# Patient Record
Sex: Female | Born: 2000
Health system: Southern US, Community
[De-identification: ages and names within clinical notes are randomized; demographics above are authoritative.]

## PROBLEM LIST (undated history)

## (undated) DIAGNOSIS — F909 Attention-deficit hyperactivity disorder, unspecified type: Secondary | ICD-10-CM

## (undated) DIAGNOSIS — G43909 Migraine, unspecified, not intractable, without status migrainosus: Secondary | ICD-10-CM

## (undated) DIAGNOSIS — R339 Retention of urine, unspecified: Secondary | ICD-10-CM

## (undated) DIAGNOSIS — T7840XA Allergy, unspecified, initial encounter: Secondary | ICD-10-CM

## (undated) DIAGNOSIS — G129 Spinal muscular atrophy, unspecified: Secondary | ICD-10-CM

## (undated) DIAGNOSIS — F329 Major depressive disorder, single episode, unspecified: Secondary | ICD-10-CM

## (undated) DIAGNOSIS — K219 Gastro-esophageal reflux disease without esophagitis: Secondary | ICD-10-CM

## (undated) DIAGNOSIS — J45909 Unspecified asthma, uncomplicated: Secondary | ICD-10-CM

## (undated) DIAGNOSIS — F32A Depression, unspecified: Secondary | ICD-10-CM

## (undated) HISTORY — DX: Migraine, unspecified, not intractable, without status migrainosus: G43.909

## (undated) HISTORY — DX: Allergy, unspecified, initial encounter: T78.40XA

## (undated) HISTORY — DX: Attention-deficit hyperactivity disorder, unspecified type: F90.9

## (undated) HISTORY — DX: Major depressive disorder, single episode, unspecified: F32.9

## (undated) HISTORY — DX: Depression, unspecified: F32.A

## (undated) HISTORY — PX: KNEE SURGERY: SHX244

## (undated) HISTORY — PX: CLUB FOOT RELEASE: SHX1363

## (undated) SURGERY — EGD (ESOPHAGOGASTRODUODENOSCOPY)
Anesthesia: Moderate Sedation

---
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
## 2018-01-18 ENCOUNTER — Encounter: Payer: Self-pay | Admitting: Primary Care

## 2018-01-18 ENCOUNTER — Ambulatory Visit: Payer: BLUE CROSS/BLUE SHIELD | Admitting: Primary Care

## 2018-01-18 VITALS — BP 100/62 | HR 81 | Temp 98.2°F | Ht 59.75 in | Wt 98.2 lb

## 2018-01-18 DIAGNOSIS — G121 Other inherited spinal muscular atrophy: Secondary | ICD-10-CM | POA: Diagnosis not present

## 2018-01-18 DIAGNOSIS — N946 Dysmenorrhea, unspecified: Secondary | ICD-10-CM | POA: Diagnosis not present

## 2018-01-18 LAB — POCT URINE PREGNANCY: Preg Test, Ur: NEGATIVE

## 2018-01-18 MED ORDER — NORETHIN ACE-ETH ESTRAD-FE 1-20 MG-MCG PO TABS: 1.0000 | ORAL_TABLET | Freq: Every day | ORAL | 3 refills | Status: DC

## 2018-01-18 NOTE — Patient Instructions (Addendum)
Take your first birth control pill on the Sunday after your period starts. For example, if you start your period on Monday, then take the birth control pill that following Sunday. If you start your period on Sunday, then take your first pill that same day.  You must take your pill at the same time each day.  If you miss a pill then take the pill as soon as you remember, and also take your pill at the regularly scheduled time, even if this means taking two pills in one day. If you miss more than two pills consecutively, then please call me.  It may take three to six months for birth control pills to regulate your cycle and improve symptoms.  It was a pleasure to meet you today! Please don't hesitate to call or message me with any questions. Welcome to [facility]!   Oral Contraception Information Oral contraceptive pills (OCPs) are medicines taken to prevent pregnancy. OCPs work by preventing the ovaries from releasing eggs. The hormones in OCPs also cause the cervical mucus to thicken, preventing the sperm from entering the uterus. The hormones also cause the uterine lining to become thin, not allowing a fertilized egg to attach to the inside of the uterus. OCPs are highly effective when taken exactly as prescribed. However, OCPs do not prevent sexually transmitted diseases (STDs). Safe sex practices, such as using condoms along with the pill, can help prevent STDs. Before taking the pill, you may have a physical exam and Pap test. Your health care provider may order blood tests. The health care provider will make sure you are a good candidate for oral contraception. Discuss with your health care provider the possible side effects of the OCP you may be prescribed. When starting an OCP, it can take 2 to 3 months for the body to adjust to the changes in hormone levels in your body. Types of oral contraception  The combination pill-This pill contains estrogen and progestin (synthetic progesterone)  hormones. The combination pill comes in 21-day, 28-day, or 91-day packs. Some types of combination pills are meant to be taken continuously (365-day pills). With 21-day packs, you do not take pills for 7 days after the last pill. With 28-day packs, the pill is taken every day. The last 7 pills are without hormones. Certain types of pills have more than 21 hormone-containing pills. With 91-day packs, the first 84 pills contain both hormones, and the last 7 pills contain no hormones or contain estrogen only.  The minipill-This pill contains the progesterone hormone only. The pill is taken every day continuously. It is very important to take the pill at the same time each day. The minipill comes in packs of 28 pills. All 28 pills contain the hormone. Advantages of oral contraceptive pills  Decreases premenstrual symptoms.  Treats menstrual period cramps.  Regulates the menstrual cycle.  Decreases a heavy menstrual flow.  May treatacne, depending on the type of pill.  Treats abnormal uterine bleeding.  Treats polycystic ovarian syndrome.  Treats endometriosis.  Can be used as emergency contraception. Things that can make oral contraceptive pills less effective OCPs can be less effective if:  You forget to take the pill at the same time every day.  You have a stomach or intestinal disease that lessens the absorption of the pill.  You take OCPs with other medicines that make OCPs less effective, such as antibiotics, certain HIV medicines, and some seizure medicines.  You take expired OCPs.  You forget to restart the  pill on day 7, when using the packs of 21 pills.  Risks associated with oral contraceptive pills Oral contraceptive pills can sometimes cause side effects, such as:  Headache.  Nausea.  Breast tenderness.  Irregular bleeding or spotting.  Combination pills are also associated with a small increased risk of:  Blood clots.  Heart attack.  Stroke.  This  information is not intended to replace advice given to you by your health care provider. Make sure you discuss any questions you have with your health care provider. Document Released: 12/19/2002 Document Revised: 03/05/2016 Document Reviewed: 03/19/2013 Elsevier Interactive Patient Education  [employer]2018 Elsevier Inc.

## 2018-01-18 NOTE — Progress Notes (Signed)
Subjective:    Patient ID: Terry Newton, female    DOB: 09-30-2001, 17 y.o.   MRN: [id number]  HPI  Terry Newton is a 17 year old female who presents today to establish care and discuss the problems mentioned below. Will obtain old records.  1) Spinal Muscular Atrophy: Diagnosed around the age of 18 and confirmed through genetic testing (DYNC1H1). Currently following with [facility] annually. Ambulates with L[place]strand crutches and does well.   2) Dysmenorrhea: Menarche in 8th grade. LMP 01/05/18. Has been under additional stress since transitioning from high school to college.  She'll experience severe cramping during her menstrual cycles which last 3-4 days on average. Symptoms began in December 2018. She'll take Tylenol and Ibuprofen without any improvement. She also reports a heavy menstrual period on 01/05/18 which was her LMP. She's noticed more irritability that is most prominent during her menstrual cycle. She is sexually active, uses protection with condoms. She is not on birth control and has never been on birth control in the past. She's interested in birth control pills.   Review of Systems  Constitutional: Negative for fever.  Respiratory: Negative for shortness of breath.   Cardiovascular: Negative for chest pain.  Genitourinary: Positive for menstrual problem. Negative for dysuria, frequency, pelvic pain and vaginal discharge.  Neurological:       Chronic lower extremity weakness secondary to musclar atrophy       Past Medical History:  Diagnosis Date  . GERD (gastroesophageal reflux disease)   . SMA (spinal muscular atrophy) (HCC)   . Urinary retention    Extended bladder, holds urine for extended periods of time.     Social History   Socioeconomic History  . Marital status: Single    Spouse name: Not on file  . Number of children: Not on file  . Years of education: Not on file  . Highest education level: Not on file  Occupational History  .  Not on file  Social Needs  . Financial resource strain: Not on file  . Food insecurity:    Worry: Not on file    Inability: Not on file  . Transportation needs:    Medical: Not on file    Non-medical: Not on file  Tobacco Use  . Smoking status: Never Smoker  . Smokeless tobacco: Never Used  Substance and Sexual Activity  . Alcohol use: No  . Drug use: No  . Sexual activity: Never    Birth control/protection: Abstinence    Comment: LMP this past weekend.  LMP prior was 65 days ago.  Periods are usually regular with only a little cramping.  Lifestyle  . Physical activity:    Days per week: Not on file    Minutes per session: Not on file  . Stress: Not on file  Relationships  . Social connections:    Talks on phone: Not on file    Gets together: Not on file    Attends religious service: Not on file    Active member of club or organization: Not on file    Attends meetings of clubs or organizations: Not on file    Relationship status: Not on file  . Intimate partner violence:    Fear of current or ex partner: Not on file    Emotionally abused: Not on file    Physically abused: Not on file    Forced sexual activity: Not on file  Other Topics Concern  . Not on file  Social History  Narrative   Birth history - Born by repeat c/s, no complications during pregnancy or delivery.  No sick contacts.  No thoughts of anxiety or self harm.  Lives with mother, father, 1 dog, no smoke exposure.  Patient was delayed at reaching developmental milestones, but once they were reached she has developed fine.      Single.   Attends [employer] and is studying Administration.    Enjoys spending time with boyfriend.     Past Surgical History:  Procedure Laterality Date  . CLUB FOOT RELEASE    . ESOPHAGOGASTRODUODENOSCOPY N/A 06/12/2016   Procedure: ESOPHAGOGASTRODUODENOSCOPY (EGD);  Surgeon: Adelene Amasichard Quan, MD;  [facility];  Service: Endoscopy;  Laterality: N/A;  . KNEE SURGERY     Plates  placed to bilateral knees to slow growth.    No family history on file.  Allergies  Allergen Reactions  . Pineapple Nausea And Vomiting    No current outpatient medications on file prior to visit.   No current facility-administered medications on file prior to visit.     BP 100/62   Pulse 81   Temp 98.2 F (36.8 C) (Oral)   Ht 4' 11.75" (1.518 m) Comment: with boots  Wt 98 lb 4 oz (44.6 kg)   LMP 12/31/2017   SpO2 98%   BMI 19.35 kg/m    Objective:   Physical Exam  Constitutional: She appears well-nourished.  Neck: Neck supple.  Cardiovascular: Normal rate and regular rhythm.  Pulmonary/Chest: Effort normal and breath sounds normal.  Musculoskeletal:  Ambulates well with Lofstrand crutches  Skin: Skin is warm and dry.  Psychiatric: She has a normal mood and affect.          Assessment & Plan:

## 2018-01-18 NOTE — Assessment & Plan Note (Signed)
Present since December 2018, also starting to notice heavy menstrual cycles and irritability. Also wishes to start OCP's for prevention of pregnancy as she is sexually active.  Urine pregnancy today negative. Rx for Junel FE sent to pharmacy. Discussed start date, instructions for missed pills, expectations for improved symptoms, back up coverage for 7 days.   Pap smear due at age 17.

## 2018-01-18 NOTE — Assessment & Plan Note (Signed)
Following [facility]. Ambulates well with Lofstrand crutches.

## 2018-01-29 ENCOUNTER — Encounter: Payer: Self-pay | Admitting: Primary Care

## 2018-03-11 ENCOUNTER — Ambulatory Visit: Payer: BLUE CROSS/BLUE SHIELD | Admitting: Primary Care

## 2018-03-11 ENCOUNTER — Encounter: Payer: Self-pay | Admitting: Primary Care

## 2018-03-11 VITALS — BP 112/70 | HR 81 | Temp 98.1°F | Ht 59.75 in | Wt 96.5 lb

## 2018-03-11 DIAGNOSIS — R300 Dysuria: Secondary | ICD-10-CM

## 2018-03-11 LAB — POC URINALSYSI DIPSTICK (AUTOMATED)
Bilirubin, UA: NEGATIVE
Glucose, UA: NEGATIVE
KETONES UA: NEGATIVE
Nitrite, UA: NEGATIVE
PROTEIN UA: NEGATIVE
RBC UA: NEGATIVE
Urobilinogen, UA: 0.2 E.U./dL
pH, UA: 6 (ref 5.0–8.0)

## 2018-03-11 NOTE — Patient Instructions (Signed)
Make sure to drink plenty of water to stay hydrated.  I'll be in touch once I receive your urine culture results.  It was a pleasure to see you today!

## 2018-03-11 NOTE — Progress Notes (Signed)
Subjective:    Patient ID: Terry Newton, female    DOB: 03/02/2001, 17 y.o.   MRN: [id number]  HPI  Terry Newton is a 17 year old female with a history of muscular atrophy and neuromuscular weakness who presents today with a chief complaint of dysuria.  She also reports slight pelvic pressure but thinks this was constipation as she ate a large meal for dinner. Pelvic pressure was relieved with a large bowel movement the following day. She's also noticed some urinary frequency.   Symptoms began one week ago when transitioning to her menstrual cycle on her pill pack. Denies fevers, hematuria. She started noticing an improvement in symptoms 3 days ago after getting back from the beach. She's been drinking some cranberry juice, has not taken anything OTC for symptoms.  Review of Systems  Constitutional: Negative for fever.  Gastrointestinal: Negative for abdominal pain and nausea.  Genitourinary: Positive for dysuria and frequency. Negative for difficulty urinating, hematuria, pelvic pain and vaginal discharge.       Past Medical History:  Diagnosis Date  . ADHD    On Ritalin, Vyvanse  . Depression   . GERD (gastroesophageal reflux disease)   . Migraines   . SMA (spinal muscular atrophy) (HCC)   . Urinary retention    Extended bladder, holds urine for extended periods of time.     Social History   Socioeconomic History  . Marital status: Single    Spouse name: Not on file  . Number of children: Not on file  . Years of education: Not on file  . Highest education level: Not on file  Occupational History  . Not on file  Social Needs  . Financial resource strain: Not on file  . Food insecurity:    Worry: Not on file    Inability: Not on file  . Transportation needs:    Medical: Not on file    Non-medical: Not on file  Tobacco Use  . Smoking status: Never Smoker  . Smokeless tobacco: Never Used  Substance and Sexual Activity  . Alcohol use: No  . Drug use: No  .  Sexual activity: Never    Birth control/protection: Abstinence    Comment: LMP this past weekend.  LMP prior was 65 days ago.  Periods are usually regular with only a little cramping.  Lifestyle  . Physical activity:    Days per week: Not on file    Minutes per session: Not on file  . Stress: Not on file  Relationships  . Social connections:    Talks on phone: Not on file    Gets together: Not on file    Attends religious service: Not on file    Active member of club or organization: Not on file    Attends meetings of clubs or organizations: Not on file    Relationship status: Not on file  . Intimate partner violence:    Fear of current or ex partner: Not on file    Emotionally abused: Not on file    Physically abused: Not on file    Forced sexual activity: Not on file  Other Topics Concern  . Not on file  Social History Narrative   Birth history - Born by repeat c/s, no complications during pregnancy or delivery.  No sick contacts.  No thoughts of anxiety or self harm.  Lives with mother, father, 1 dog, no smoke exposure.  Patient was delayed at reaching developmental milestones, but once they were reached  she has developed fine.      Single.   Attends [employer] and is studying Administration.    Enjoys spending time with boyfriend.     Past Surgical History:  Procedure Laterality Date  . CLUB FOOT RELEASE    . ESOPHAGOGASTRODUODENOSCOPY N/A 06/12/2016   Procedure: ESOPHAGOGASTRODUODENOSCOPY (EGD);  Surgeon: Adelene Amas, MD;  Location: [facility]COPY;  Service: Endoscopy;  Laterality: N/A;  . KNEE SURGERY     Plates placed to bilateral knees to slow growth.    No family history on file.  Allergies  Allergen Reactions  . Pineapple Nausea And Vomiting    Current Outpatient Medications on File Prior to Visit  Medication Sig Dispense Refill  . norethindrone-ethinyl estradiol (JUNEL FE 1/20) 1-20 MG-MCG tablet Take 1 tablet by mouth daily. 3 Package 3   No current  facility-administered medications on file prior to visit.     BP 112/70   Pulse 81   Temp 98.1 F (36.7 C) (Oral)   Ht 4' 11.75" (1.518 m)   Wt 96 lb 8 oz (43.8 kg)   LMP 02/06/2018   SpO2 98%   BMI 19.00 kg/m    Objective:   Physical Exam  Constitutional: She appears well-nourished.  Neck: Neck supple.  Cardiovascular: Normal rate and regular rhythm.  Respiratory: Effort normal and breath sounds normal.  GI: Soft. Normal appearance. There is no CVA tenderness.  Skin: Skin is warm and dry.           Assessment & Plan:  Dysuria:  Also with frequency x 4-5 days, improved and mostly resolved now. Exam today unremarkable. No vaginal symptoms. UA today with 1+ leuks, negative nitrites and blood. Culture sent. Push intake of water. Continue with small amount of cranberry juice daily. Will await urine culture results.   Doreene Nest, NP

## 2018-03-12 LAB — URINE CULTURE
MICRO NUMBER: 90657988
SPECIMEN QUALITY:: ADEQUATE

## 2018-06-29 ENCOUNTER — Ambulatory Visit: Payer: BLUE CROSS/BLUE SHIELD | Admitting: Family Medicine

## 2018-06-29 ENCOUNTER — Encounter: Payer: Self-pay | Admitting: Family Medicine

## 2018-06-29 VITALS — BP 104/62 | HR 107 | Temp 98.5°F | Ht 59.75 in | Wt 96.5 lb

## 2018-06-29 DIAGNOSIS — J069 Acute upper respiratory infection, unspecified: Secondary | ICD-10-CM

## 2018-06-29 DIAGNOSIS — B9789 Other viral agents as the cause of diseases classified elsewhere: Secondary | ICD-10-CM

## 2018-06-29 MED ORDER — BENZONATATE 200 MG PO CAPS: 200.0000 mg | ORAL_CAPSULE | Freq: Three times a day (TID) | ORAL | 1 refills | Status: DC | PRN

## 2018-06-29 NOTE — Progress Notes (Signed)
Subjective:    Patient ID: Terry Newton, female    DOB: 2001-07-15, 16 y.o.   MRN: [id number]  HPI Here for uri symptoms / ? Or allergies   17 yo  Pt of NP [facility]   Started with sneezing and runny nose  A week later woke up with dry /sore throat  Then a cough out of nowhere   Cough is prod - phlegm - just a little / clear  Nasal d/c =clear also   [facility] brand allergy med  No nasal congestion  Started taking delsym - helps more during the day than night time    Feels pressure in her ears occ headache  No facial pain   Patient Active Problem List   Diagnosis Date Noted  . Viral URI with cough 06/29/2018  . Dysmenorrhea 01/18/2018  . 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 Constipation 06/13/2016  . Neuromuscular weakness (HCC) 11/23/2011   Past Medical History:  Diagnosis Date  . ADHD    On Ritalin, Vyvanse  . Depression   . GERD (gastroesophageal reflux disease)   . Migraines   . SMA (spinal muscular atrophy) (HCC)   . Urinary retention    Extended bladder, holds urine for extended periods of time.   Past Surgical History:  Procedure Laterality Date  . CLUB FOOT RELEASE    . ESOPHAGOGASTRODUODENOSCOPY N/A 06/12/2016   Procedure: ESOPHAGOGASTRODUODENOSCOPY (EGD);  Surgeon: Adelene Amas, MD;  Location: [facility] ENDOSCOPY;  Service: Endoscopy;  Laterality: N/A;  . KNEE SURGERY     Plates placed to bilateral knees to slow growth.   Social History   Tobacco Use  . Smoking status: Never Smoker  . Smokeless tobacco: Never Used  Substance Use Topics  . Alcohol use: No  . Drug use: No   History reviewed. No pertinent family history. Allergies  Allergen Reactions  . Pineapple Nausea And Vomiting   Current Outpatient Medications on File Prior to Visit  Medication Sig Dispense Refill  . norethindrone-ethinyl estradiol (JUNEL FE 1/20) 1-20 MG-MCG tablet Take 1 tablet by mouth daily. 3 Package 3   No  current facility-administered medications on file prior to visit.      Review of Systems  Constitutional: Positive for appetite change and fatigue. Negative for fever.  HENT: Positive for congestion, postnasal drip, rhinorrhea, sinus pressure, sneezing and sore throat. Negative for ear pain.   Eyes: Negative for pain and discharge.  Respiratory: Positive for cough. Negative for shortness of breath, wheezing and stridor.   Cardiovascular: Negative for chest pain.  Gastrointestinal: Negative for diarrhea, nausea and vomiting.  Genitourinary: Negative for frequency, hematuria and urgency.  Musculoskeletal: Negative for arthralgias and myalgias.  Skin: Negative for rash.  Neurological: Positive for headaches. Negative for dizziness, weakness and light-headedness.  Psychiatric/Behavioral: Negative for confusion and dysphoric mood.       Objective:   Physical Exam  Constitutional: She appears well-developed and well-nourished. No distress.  Well appearing   HENT:  Head: Normocephalic and atraumatic.  Right Ear: External ear normal.  Left Ear: External ear normal.  Mouth/Throat: Oropharynx is clear and moist.  Nares are injected and congested  No sinus tenderness Clear rhinorrhea and post nasal drip   Eyes: Pupils are equal, round, and reactive to light. Conjunctivae and EOM are normal. Right eye exhibits no discharge. Left eye exhibits no discharge.  Neck: Normal range of motion. Neck supple.  Cardiovascular: Normal rate and normal heart sounds.  Pulmonary/Chest: Effort normal  and breath sounds normal. No respiratory distress. She has no wheezes. She has no rales. She exhibits no tenderness.  Lymphadenopathy:    She has no cervical adenopathy.  Neurological: She is alert. No cranial nerve deficit.  Skin: Skin is warm and dry. No rash noted.  Psychiatric: She has a normal mood and affect.          Assessment & Plan:   Problem List Items Addressed This Visit      Respiratory     Viral URI with cough - Primary    Reassuring exam  Disc symptomatic care - see instructions on AVS  Added tessalon prn cough  Update if not starting to improve in a week or if worsening

## 2018-06-29 NOTE — Assessment & Plan Note (Signed)
Reassuring exam  Disc symptomatic care - see instructions on AVS  Added tessalon prn cough  Update if not starting to improve in a week or if worsening

## 2018-06-29 NOTE — Patient Instructions (Addendum)
I think you have a viral cold plus or minus allergies  Continue delsym  Try zyrtec 10 mg once daily for runny nose/sneezing and drip  Also nasal saline spray 1-2 times per day  Tylenol for headache as needed   Try the tessalon for cough as well   Watch for sinus pressure or fever or worse cough   Update if not starting to improve in a week or if worsening

## 2018-07-06 ENCOUNTER — Ambulatory Visit: Payer: BLUE CROSS/BLUE SHIELD | Admitting: Primary Care

## 2018-08-19 NOTE — Telephone Encounter (Signed)
Will you please call patient?   

## 2018-08-22 NOTE — Telephone Encounter (Signed)
I have called patient earlier today. She stated that she was concern that when she taking the "brown pill" regarding her period. She noticed that her period would last 1 week and the last time it was only for 1 day.    Patient stated that to please reply through MyChart.

## 2018-11-09 ENCOUNTER — Ambulatory Visit: Payer: BLUE CROSS/BLUE SHIELD | Admitting: Primary Care

## 2018-11-09 ENCOUNTER — Encounter: Payer: Self-pay | Admitting: Primary Care

## 2018-11-09 VITALS — BP 114/72 | HR 70 | Temp 98.2°F | Ht 59.75 in | Wt 98.2 lb

## 2018-11-09 DIAGNOSIS — R51 Headache: Secondary | ICD-10-CM | POA: Diagnosis not present

## 2018-11-09 DIAGNOSIS — R0602 Shortness of breath: Secondary | ICD-10-CM

## 2018-11-09 DIAGNOSIS — R519 Headache, unspecified: Secondary | ICD-10-CM

## 2018-11-09 DIAGNOSIS — J45990 Exercise induced bronchospasm: Secondary | ICD-10-CM | POA: Insufficient documentation

## 2018-11-09 MED ORDER — ALBUTEROL SULFATE HFA 108 (90 BASE) MCG/ACT IN AERS: 2.0000 | INHALATION_SPRAY | RESPIRATORY_TRACT | 0 refills | Status: DC | PRN

## 2018-11-09 MED ORDER — ALBUTEROL SULFATE (2.5 MG/3ML) 0.083% IN NEBU
2.5000 mg | INHALATION_SOLUTION | Freq: Once | RESPIRATORY_TRACT | Status: AC
  Administered 2018-11-09: 2.5 mg via RESPIRATORY_TRACT

## 2018-11-09 NOTE — Assessment & Plan Note (Signed)
Daily over the last one month, sounds like she had an actual migraine recently. Neuro exam unremarkable. Recommend she start with eye exam first. If eye exam unremarkable and her symptoms persist then will have her back in for follow up.

## 2018-11-09 NOTE — Assessment & Plan Note (Addendum)
Intermittent for the last one month. Also with wheezing. Exam unremarkable today. Interesting that she's found immediate relief with SABA use. Spirometry today with mild obstruction, improvement with bronchodilator. Suspect exercise-induced asthma given symptoms with prolonged walking.  Prescription for albuterol inhaler sent to pharmacy.  Discussed to notify if she requires use of her albuterol more than with exercise/3 times weekly.

## 2018-11-09 NOTE — Addendum Note (Signed)
Addended by: Tawnya Crook on: 11/09/2018 05:25 PM   Modules accepted: Orders

## 2018-11-09 NOTE — Progress Notes (Signed)
Subjective:    Patient ID: Terry Newton, female    DOB: 10/09/2001, 18 y.o.   MRN: [id number]  HPI  Terry Newton is a 18 year old female with a history of autosomal dominant lower extremity predominant spinal muscular atrophy who presents today with multiple complaints.  1) Shortness of Breath: She also reports wheezing. This began since the weather changed from summer/fall to winter. She mostly noticed it towards late December /January this year. Her symptoms will begin when walking long distances such as walking across campus (approx 1 mile).   She's been using her boyfriend's mother inhaler, 1-2 puffs three times weekly for the last week with immediate resolve in shortness of breath and wheezing. She denies prior history of asthma, cough, rhinorrhea, post nasal drip, sneezing. She has a history of seasonal allergies with rhinorrhea.  2) Headaches: Located to bilateral frontal lobe which began in December 2019. Occur when in class, looking at a computer, looking at textbooks. She finds herself having to squint. Last eye exam was in 2012. Headaches occur daily. She has mild photophobia, some nausea. She's taken Tylenol/Ibuprofen with relief. She denies phonophobia. No prior history of migraines.   Review of Systems  Eyes: Positive for photophobia.  Respiratory: Positive for shortness of breath and wheezing.   Allergic/Immunologic: Negative for environmental allergies.  Neurological: Positive for headaches.       Past Medical History:  Diagnosis Date  . ADHD    On Ritalin, Vyvanse  . Depression   . GERD (gastroesophageal reflux disease)   . Migraines   . SMA (spinal muscular atrophy) (HCC)   . Urinary retention    Extended bladder, holds urine for extended periods of time.     Social History   Socioeconomic History  . Marital status: Single    Spouse name: Not on file  . Number of children: Not on file  . Years of education: Not on file  . Highest education level: Not  on file  Occupational History  . Not on file  Social Needs  . Financial resource strain: Not on file  . Food insecurity:    Worry: Not on file    Inability: Not on file  . Transportation needs:    Medical: Not on file    Non-medical: Not on file  Tobacco Use  . Smoking status: Never Smoker  . Smokeless tobacco: Never Used  Substance and Sexual Activity  . Alcohol use: No  . Drug use: No  . Sexual activity: Never    Birth control/protection: Abstinence    Comment: LMP this past weekend.  LMP prior was 65 days ago.  Periods are usually regular with only a little cramping.  Lifestyle  . Physical activity:    Days per week: Not on file    Minutes per session: Not on file  . Stress: Not on file  Relationships  . Social connections:    Talks on phone: Not on file    Gets together: Not on file    Attends religious service: Not on file    Active member of club or organization: Not on file    Attends meetings of clubs or organizations: Not on file    Relationship status: Not on file  . Intimate partner violence:    Fear of current or ex partner: Not on file    Emotionally abused: Not on file    Physically abused: Not on file    Forced sexual activity: Not on file  Other  Topics Concern  . Not on file  Social History Narrative   Birth history - Born by repeat c/s, no complications during pregnancy or delivery.  No sick contacts.  No thoughts of anxiety or self harm.  Lives with mother, father, 1 dog, no smoke exposure.  Patient was delayed at reaching developmental milestones, but once they were reached she has developed fine.      Single.   Attends [employer] and is studying Administration.    Enjoys spending time with boyfriend.     Past Surgical History:  Procedure Laterality Date  . CLUB FOOT RELEASE    . ESOPHAGOGASTRODUODENOSCOPY N/A 06/12/2016   Procedure: ESOPHAGOGASTRODUODENOSCOPY (EGD);  Surgeon: Adelene Amas, MD;  Location: [facility] ENDOSCOPY;  Service: Endoscopy;  Laterality:  N/A;  . KNEE SURGERY     Plates placed to bilateral knees to slow growth.    No family history on file.  Allergies  Allergen Reactions  . Pineapple Nausea And Vomiting    Current Outpatient Medications on File Prior to Visit  Medication Sig Dispense Refill  . norethindrone-ethinyl estradiol (JUNEL FE 1/20) 1-20 MG-MCG tablet Take 1 tablet by mouth daily. 3 Package 3   No current facility-administered medications on file prior to visit.     BP 114/72   Pulse 70   Temp 98.2 F (36.8 C) (Oral)   Ht 4' 11.75" (1.518 m)   Wt 98 lb 4 oz (44.6 kg)   LMP 11/09/2018   SpO2 98%   BMI 19.35 kg/m    Objective:   Physical Exam  Constitutional: She is oriented to person, place, and time. She appears well-nourished. She does not appear ill.  HENT:  Right Ear: Tympanic membrane and ear canal normal.  Left Ear: Tympanic membrane and ear canal normal.  Nose: No mucosal edema. Right sinus exhibits no maxillary sinus tenderness and no frontal sinus tenderness. Left sinus exhibits no maxillary sinus tenderness and no frontal sinus tenderness.  Mouth/Throat: Oropharynx is clear and moist.  Eyes: EOM are normal.  Neck: Neck supple.  Cardiovascular: Normal rate and regular rhythm.  Respiratory: Effort normal and breath sounds normal. She has no wheezes.  Neurological: She is alert and oriented to person, place, and time. No cranial nerve deficit.  Skin: Skin is warm and dry.           Assessment & Plan:

## 2018-11-09 NOTE — Patient Instructions (Signed)
Shortness of Breath/Wheezing/Cough: Use the albuterol inhaler. Inhale 2 puffs into the lungs every 4 to 6 hours as needed for wheezing, cough, and/or shortness of breath.   Please notify me if you require use of this inhaler for more than three days weekly or outside of exercise.  You will be contacted regarding your referral to the eye.  Please let us know if you have not been contacted within one week.   You can use Tylenol, Ibuprofen, Excedrin Migraine as needed.  It was a pleasure to see you today!

## 2018-12-02 ENCOUNTER — Encounter: Payer: Self-pay | Admitting: Primary Care

## 2018-12-02 DIAGNOSIS — G43119 Migraine with aura, intractable, without status migrainosus: Secondary | ICD-10-CM | POA: Diagnosis not present

## 2018-12-02 DIAGNOSIS — H5203 Hypermetropia, bilateral: Secondary | ICD-10-CM | POA: Diagnosis not present

## 2018-12-12 ENCOUNTER — Other Ambulatory Visit: Payer: Self-pay | Admitting: Primary Care

## 2018-12-12 DIAGNOSIS — N946 Dysmenorrhea, unspecified: Secondary | ICD-10-CM

## 2019-03-22 ENCOUNTER — Ambulatory Visit (INDEPENDENT_AMBULATORY_CARE_PROVIDER_SITE_OTHER): Payer: Medicare Other | Admitting: Primary Care

## 2019-03-22 ENCOUNTER — Encounter: Payer: Self-pay | Admitting: Primary Care

## 2019-03-22 DIAGNOSIS — R112 Nausea with vomiting, unspecified: Secondary | ICD-10-CM

## 2019-03-22 DIAGNOSIS — R110 Nausea: Secondary | ICD-10-CM | POA: Insufficient documentation

## 2019-03-22 MED ORDER — OMEPRAZOLE 20 MG PO CPDR: 20.0000 mg | DELAYED_RELEASE_CAPSULE | Freq: Every day | ORAL | 0 refills | Status: DC

## 2019-03-22 NOTE — Patient Instructions (Signed)
Start omeprazole 20 mg daily for acid buildup. Take 1 capsule every evening with dinner.  Please update me in one week as discussed, or sooner if needed.  Avoid laying flat within 2 hours after dinner. Avoid spicy/acidic/greasy food, avoid excessive caffeine.  It was a pleasure to see you today! Allie Bossier, NP-C

## 2019-03-22 NOTE — Progress Notes (Signed)
Subjective:    Patient ID: Terry Newton, female    DOB: 10/23/2000, 18 y.o.   MRN: [id number]  HPI  Virtual Visit via Video Note  I connected with Terry Newton on 03/22/19 at  8:00 AM EDT by a video enabled telemedicine application and verified that I am speaking with the correct person using two identifiers.  Location: Patient: Office Provider: Home   I discussed the limitations of evaluation and management by telemedicine and the availability of in person appointments. The patient expressed understanding and agreed to proceed.  History of Present Illness:  Ms. Terry Newton is a 18 year old female with a history of muscular atrophy DYNC1H1, exercise induced asthma who presents today with a chief complaint of nausea and vomiting.  She reports feeling "shaky" and nauseated when waking up in the morning. She'll drink water and lay back down without improvement. During the day she will feel nauseated within 30 minutes after each meal, has vomited a few times. The nausea will last for several hours between meals. These symptoms began about 2-3 weeks ago.   She denies esophageal burning, abdominal pain, fevers, decrease in appetite. She's had this occur before in high school, admitted to the hospital for recurrent vomiting and abdominal spasms. She was prescribed cyproheptadine at discharge with improvement.   She doesn't feel nauseated after drinking, only after eating. She's taken Tums for her symptoms with temporary improvement in nausea. She is trying to eat three meals daily, staying hydrated. She denies that she is pregnant.     Observations/Objective:  Alert and oriented. Appears well, not sickly. No distress. Speaking in complete sentences.   Assessment and Plan:  Symptoms suggestive of GERD, especially since her symptoms are worse in the morning when waking and will occur with meals. Tums do provide slight/temporary improvement.   She doesn't appear sickly, is  well hydrated. Will start with omeprazole 20 mg daily in the evening, she will update in one week. Also discussed triggers for GERD.  If no improvement then consider lab work up with abdominal ultrasound to evaluate gall bladder. She has no abdominal pain now.  Follow Up Instructions:  Start omeprazole 20 mg daily for acid buildup. Take 1 capsule every evening with dinner.  Please update me in one week as discussed, or sooner if needed.  Avoid laying flat within 2 hours after dinner. Avoid spicy/acidic/greasy food, avoid excessive caffeine.  It was a pleasure to see you today! [name], NP-C     I discussed the assessment and treatment plan with the patient. The patient was provided an opportunity to ask questions and all were answered. The patient agreed with the plan and demonstrated an understanding of the instructions.   The patient was advised to call back or seek an in-person evaluation if the symptoms worsen or if the condition fails to improve as anticipated.     Doreene NestKatherine K Oveda Dadamo, NP    Review of Systems  Constitutional: Negative for fever.  Gastrointestinal: Positive for nausea and vomiting. Negative for abdominal pain, constipation and diarrhea.  Neurological: Negative for dizziness.       Past Medical History:  Diagnosis Date  . ADHD    On Ritalin, Vyvanse  . Depression   . GERD (gastroesophageal reflux disease)   . Migraines   . SMA (spinal muscular atrophy) (HCC)   . Urinary retention    Extended bladder, holds urine for extended periods of time.     Social History   Socioeconomic  History  . Marital status: Single    Spouse name: Not on file  . Number of children: Not on file  . Years of education: Not on file  . Highest education level: Not on file  Occupational History  . Not on file  Social Needs  . Financial resource strain: Not on file  . Food insecurity:    Worry: Not on file    Inability: Not on file  . Transportation needs:     Medical: Not on file    Non-medical: Not on file  Tobacco Use  . Smoking status: Never Smoker  . Smokeless tobacco: Never Used  Substance and Sexual Activity  . Alcohol use: No  . Drug use: No  . Sexual activity: Never    Birth control/protection: Abstinence    Comment: LMP this past weekend.  LMP prior was 65 days ago.  Periods are usually regular with only a little cramping.  Lifestyle  . Physical activity:    Days per week: Not on file    Minutes per session: Not on file  . Stress: Not on file  Relationships  . Social connections:    Talks on phone: Not on file    Gets together: Not on file    Attends religious service: Not on file    Active member of club or organization: Not on file    Attends meetings of clubs or organizations: Not on file    Relationship status: Not on file  . Intimate partner violence:    Fear of current or ex partner: Not on file    Emotionally abused: Not on file    Physically abused: Not on file    Forced sexual activity: Not on file  Other Topics Concern  . Not on file  Social History Narrative   Birth history - Born by repeat c/s, no complications during pregnancy or delivery.  No sick contacts.  No thoughts of anxiety or self harm.  Lives with mother, father, 1 dog, no smoke exposure.  Patient was delayed at reaching developmental milestones, but once they were reached she has developed fine.      Single.   Attends [employer] and is studying Administration.    Enjoys spending time with boyfriend.     Past Surgical History:  Procedure Laterality Date  . CLUB FOOT RELEASE    . ESOPHAGOGASTRODUODENOSCOPY N/A 06/12/2016   Procedure: ESOPHAGOGASTRODUODENOSCOPY (EGD);  Surgeon: Joycelyn Rua, MD;  Location: [facility] ENDOSCOPY;  Service: Endoscopy;  Laterality: N/A;  . KNEE SURGERY     Plates placed to bilateral knees to slow growth.    No family history on file.  Allergies  Allergen Reactions  . Pineapple Nausea And Vomiting    Current Outpatient  Medications on File Prior to Visit  Medication Sig Dispense Refill  . albuterol (PROVENTIL HFA;VENTOLIN HFA) 108 (90 Base) MCG/ACT inhaler Inhale 2 puffs into the lungs every 4 (four) hours as needed for shortness of breath. 1 Inhaler 0  . JUNEL FE 1/20 1-20 MG-MCG tablet TAKE 1 TABLET BY MOUTH DAILY 84 tablet 1   No current facility-administered medications on file prior to visit.     Wt 98 lb (44.5 kg)   LMP 02/26/2019   BMI 19.30 kg/m    Objective:   Physical Exam  Constitutional: She is oriented to person, place, and time. She appears well-nourished. She does not have a sickly appearance. She does not appear ill.  Respiratory: Effort normal.  Neurological: She is alert and oriented  to person, place, and time.  Psychiatric: She has a normal mood and affect.           Assessment & Plan:

## 2019-03-22 NOTE — Assessment & Plan Note (Signed)
Symptoms suggestive of GERD, especially since her symptoms are worse in the morning when waking and will occur with meals. Tums do provide slight/temporary improvement.   She doesn't appear sickly, is well hydrated. Will start with omeprazole 20 mg daily in the evening, she will update in one week. Also discussed triggers for GERD.  If no improvement then consider lab work up with abdominal ultrasound to evaluate gall bladder. She has no abdominal pain now.

## 2019-04-29 ENCOUNTER — Other Ambulatory Visit: Payer: Self-pay | Admitting: Primary Care

## 2019-04-29 DIAGNOSIS — R0602 Shortness of breath: Secondary | ICD-10-CM

## 2019-05-05 ENCOUNTER — Emergency Department
Admission: EM | Admit: 2019-05-05 | Discharge: 2019-05-05 | Disposition: A | Discharge status: Home / Self Care | Payer: No Typology Code available for payment source | Attending: Emergency Medicine | Admitting: Emergency Medicine

## 2019-05-05 ENCOUNTER — Emergency Department: Payer: No Typology Code available for payment source

## 2019-05-05 ENCOUNTER — Other Ambulatory Visit: Payer: Self-pay

## 2019-05-05 ENCOUNTER — Encounter: Payer: Self-pay | Admitting: Emergency Medicine

## 2019-05-05 DIAGNOSIS — M25561 Pain in right knee: Secondary | ICD-10-CM | POA: Diagnosis not present

## 2019-05-05 DIAGNOSIS — M79631 Pain in right forearm: Secondary | ICD-10-CM | POA: Insufficient documentation

## 2019-05-05 DIAGNOSIS — Y999 Unspecified external cause status: Secondary | ICD-10-CM | POA: Diagnosis not present

## 2019-05-05 DIAGNOSIS — S59911A Unspecified injury of right forearm, initial encounter: Secondary | ICD-10-CM | POA: Diagnosis not present

## 2019-05-05 DIAGNOSIS — R079 Chest pain, unspecified: Secondary | ICD-10-CM | POA: Diagnosis not present

## 2019-05-05 DIAGNOSIS — Z79899 Other long term (current) drug therapy: Secondary | ICD-10-CM | POA: Insufficient documentation

## 2019-05-05 DIAGNOSIS — J45909 Unspecified asthma, uncomplicated: Secondary | ICD-10-CM | POA: Insufficient documentation

## 2019-05-05 DIAGNOSIS — S299XXA Unspecified injury of thorax, initial encounter: Secondary | ICD-10-CM | POA: Diagnosis not present

## 2019-05-05 DIAGNOSIS — Y9389 Activity, other specified: Secondary | ICD-10-CM | POA: Insufficient documentation

## 2019-05-05 HISTORY — DX: Unspecified asthma, uncomplicated: J45.909

## 2019-05-05 LAB — URINALYSIS, COMPLETE (UACMP) WITH MICROSCOPIC
Bilirubin Urine: NEGATIVE
Glucose, UA: NEGATIVE mg/dL
Ketones, ur: NEGATIVE mg/dL
Nitrite: POSITIVE — AB
Protein, ur: NEGATIVE mg/dL
Specific Gravity, Urine: 1.014 (ref 1.005–1.030)
WBC, UA: >50 WBC/hpf — ABNORMAL HIGH (ref 0–5)
pH: 7 (ref 5.0–8.0)

## 2019-05-05 LAB — POCT PREGNANCY, URINE: Preg Test, Ur: NEGATIVE

## 2019-05-05 MED ORDER — MELOXICAM 15 MG PO TABS: 15.0000 mg | ORAL_TABLET | Freq: Every day | ORAL | 1 refills | Status: AC

## 2019-05-05 MED ORDER — CEPHALEXIN 500 MG PO CAPS: 500.0000 mg | ORAL_CAPSULE | Freq: Three times a day (TID) | ORAL | 0 refills | Status: AC
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
## 2019-05-05 NOTE — ED Provider Notes (Signed)
-----------------------------------------   4:28 PM on 05/05/2019 -----------------------------------------  EKG viewed and interpreted by myself shows sinus tachycardia 106 bpm with a narrow QRS, normal axis, normal intervals, no concerning ST changes.   Harvest Dark, MD 05/05/19 [id number]
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
## 2019-06-09 ENCOUNTER — Other Ambulatory Visit: Payer: Self-pay | Admitting: Primary Care

## 2019-06-09 DIAGNOSIS — N946 Dysmenorrhea, unspecified: Secondary | ICD-10-CM

## 2019-07-21 ENCOUNTER — Other Ambulatory Visit: Payer: Self-pay | Admitting: Primary Care

## 2019-07-21 DIAGNOSIS — R0602 Shortness of breath: Secondary | ICD-10-CM

## 2019-10-20 ENCOUNTER — Telehealth: Payer: Self-pay

## 2019-10-20 ENCOUNTER — Other Ambulatory Visit: Payer: Self-pay

## 2019-10-20 ENCOUNTER — Encounter: Payer: Self-pay | Admitting: Family Medicine

## 2019-10-20 ENCOUNTER — Ambulatory Visit: Payer: Medicare Other | Admitting: Family Medicine

## 2019-10-20 VITALS — BP 98/62 | HR 131 | Temp 98.1°F | Ht 60.0 in | Wt 91.8 lb

## 2019-10-20 DIAGNOSIS — R112 Nausea with vomiting, unspecified: Secondary | ICD-10-CM | POA: Diagnosis not present

## 2019-10-20 DIAGNOSIS — E860 Dehydration: Secondary | ICD-10-CM | POA: Diagnosis not present

## 2019-10-20 MED ORDER — ONDANSETRON 8 MG PO TBDP: 8.0000 mg | ORAL_TABLET | Freq: Three times a day (TID) | ORAL | 0 refills | Status: DC | PRN

## 2019-10-20 MED ORDER — PROMETHAZINE HCL 25 MG/ML IJ SOLN
25.0000 mg | Freq: Once | INTRAMUSCULAR | Status: AC
  Administered 2019-10-20: 25 mg via INTRAMUSCULAR

## 2019-10-20 NOTE — Patient Instructions (Signed)
Good to see you today  You were given an injection of promethazine for nausea. It will make you sleepy.   I have sent in anti nausea medication to your pharmacy if you need it. It may cause constipation.   While you are awake, drink 2-3 ounces of liquid (Gatorade, Propel, etc.). Gradually introduce small bites of bland food (yogurt, mashed potatoes, apple sauce, etc.)  Questions? Can call nurse line at night or over the weekend.

## 2019-10-20 NOTE — Telephone Encounter (Signed)
On 10/16/18 pt had bottom wisdom teeth removed and pt was given oxy for pain; after taking oxy pt had N&V; Dentist advised to stop oxy and take tylenol; pt has not taking oxy since 10/16/18 and pt is still N&V. Pt cannot keep food down. Pt can keep water down. No fever, no dry mouth and no abd pain; pt last urinated normal amt at midnight;dizzy,shaky, but pt continues to vomit. Pt has no covid symptoms other than vomiting, no travel and no known exposure to + covid.pt will have someone drive pt to [facility] for appt  at 11:45 today with Harlin Heys FNP. UC & ED precautions given and pt voiced understanding.

## 2019-10-20 NOTE — Telephone Encounter (Signed)
Noted  

## 2019-10-20 NOTE — Progress Notes (Signed)
Subjective:    Patient ID: Terry Newton, female    DOB: November 23, 2000, 19 y.o.   MRN: [id number]  HPI Chief Complaint  Patient presents with  . Emesis    Recent dental procedure. Given profilactic Abx and patient started having vomiting, severe. Not able to keep anything down x 2 days. Pt having projectile vomiting at times. Denies abd pain.  Pt stopped Abx and pain meds (Amoxicillin and Oxycodone) Elevated HR - pt states that she feels like her heart is racing. Pt not able to keep much water down the last 2 days, a little today   This is a 19 yo female who presents today with above cc.  She had two wisdom teeth removed 3 days ago.  Took amoxil/oxy after her procedure following food. Several hours later, had vomiting that awoke her from sleep. She stopped amoxil/oxy. Yesterday, was able to eat and drink in am, took some ibuprofen and acetaminophen then had vomiting. Was unable to keep anything down yesterday.  Has not taken anything today for pain. No vomiting today. She was able to drink 8 ounces of water this morning and keep down. Mild nausea today. Has urinated twice recently. Feels weak, shaky, denies lightheadedness, dizziness. Has had a normal BM since surgery.     Past Medical History:  Diagnosis Date  . ADHD    On Ritalin, Vyvanse  . Asthma   . Depression   . GERD (gastroesophageal reflux disease)   . Migraines   . SMA (spinal muscular atrophy) ([facility])   . Urinary retention    Extended bladder, holds urine for extended periods of time.   Past Surgical History:  Procedure Laterality Date  . CLUB FOOT RELEASE    . ESOPHAGOGASTRODUODENOSCOPY N/A 06/12/2016   Procedure: ESOPHAGOGASTRODUODENOSCOPY (EGD);  Surgeon: Joycelyn Rua, MD;  Location[facility] ENDOSCOPY;  Service: Endoscopy;  Laterality: N/A;  . KNEE SURGERY     Plates placed to bilateral knees to slow growth.   History reviewed. No pertinent family history. Social History   Tobacco Use  . Smoking status: Never Smoker   . Smokeless tobacco: Never Used  Substance Use Topics  . Alcohol use: No  . Drug use: No      Review of Systems Per HPI    Objective:   Physical Exam Vitals reviewed.  Constitutional:      General: She is not in acute distress.    Appearance: Normal appearance. She is not ill-appearing, toxic-appearing or diaphoretic.     Comments: thin  HENT:     Head: Normocephalic and atraumatic.  Eyes:     Conjunctiva/sclera: Conjunctivae normal.  Cardiovascular:     Rate and Rhythm: Regular rhythm. Tachycardia present.     Comments: Heart rate 100 after sitting in exam room.  Pulmonary:     Effort: Pulmonary effort is normal.     Breath sounds: Normal breath sounds.  Abdominal:     General: Abdomen is flat. There is no distension.     Palpations: Abdomen is soft. There is no mass.     Tenderness: There is no abdominal tenderness. There is no guarding or rebound.     Hernia: No hernia is present.  Skin:    General: Skin is warm and dry.     Capillary Refill: Capillary refill takes less than 2 seconds.     Coloration: Skin is not pale.     Comments: Good skin turgor.   Neurological:     Mental Status: She is alert  and oriented to person, place, and time.  Psychiatric:        Mood and Affect: Mood normal.        Behavior: Behavior normal.        Thought Content: Thought content normal.        Judgment: Judgment normal.       BP 98/62 (BP Location: Left Arm, Patient Position: Sitting, Cuff Size: Normal)   Pulse (!) 131   Temp 98.1 F (36.7 C) (Temporal)   Ht 5' (1.524 m)   Wt 91 lb 12.8 oz (41.6 kg)   SpO2 98%   BMI 17.93 kg/m  Wt Readings from Last 3 Encounters:  10/20/19 91 lb 12.8 oz (41.6 kg)  05/05/19 98 lb (44.5 kg)  03/22/19 98 lb (44.5 kg)       Assessment & Plan:  1. Non-intractable vomiting with nausea, unspecified vomiting type -Discussed with patient and her father.  We will give her injection of promethazine in the office and ondansetron to take at  home. -Follow-up precautions reviewed - promethazine (PHENERGAN) injection 25 mg - ondansetron (ZOFRAN-ODT) 8 MG disintegrating tablet; Take 1 tablet (8 mg total) by mouth every 8 (eight) hours as needed for nausea.  Dispense: 10 tablet; Refill: 0  2. Mild dehydration -Good color and skin turgor.  Heart rate came down with sitting.  Discussed gradual oral rehydration and restarting solids slowly. -Follow-up precautions reviewed  Patient Instructions  Good to see you today  You were given an injection of promethazine for nausea. It will make you sleepy.   I have sent in anti nausea medication to your pharmacy if you need it. It may cause constipation.   While you are awake, drink 2-3 ounces of liquid (Gatorade, Propel, etc.). Gradually introduce small bites of bland food (yogurt, mashed potatoes, apple sauce, etc.)  Questions? Can call nurse line at night or over the weekend.     This visit occurred during the SARS-CoV-2 public health emergency.  Safety protocols were in place, including screening questions prior to the visit, additional usage of staff PPE, and extensive cleaning of exam room while observing appropriate contact time as indicated for disinfecting solutions.    Olean Ree, FNP-BC  [facility], [facility]  10/20/2019 1:08 PM

## 2019-10-24 DIAGNOSIS — G129 Spinal muscular atrophy, unspecified: Secondary | ICD-10-CM | POA: Diagnosis not present

## 2019-11-19 ENCOUNTER — Other Ambulatory Visit: Payer: Self-pay | Admitting: Primary Care

## 2019-11-19 DIAGNOSIS — R0602 Shortness of breath: Secondary | ICD-10-CM

## 2019-11-19 DIAGNOSIS — N946 Dysmenorrhea, unspecified: Secondary | ICD-10-CM

## 2019-11-24 ENCOUNTER — Encounter: Payer: Self-pay | Admitting: Primary Care

## 2019-11-24 ENCOUNTER — Ambulatory Visit: Payer: Medicare Other | Admitting: Primary Care

## 2019-11-24 ENCOUNTER — Other Ambulatory Visit: Payer: Self-pay

## 2019-11-24 ENCOUNTER — Other Ambulatory Visit (HOSPITAL_COMMUNITY)
Admission: RE | Admit: 2019-11-24 | Discharge: 2019-11-24 | Disposition: A | Discharge status: Home / Self Care | Payer: Medicare Other | Source: Ambulatory Visit | Attending: Primary Care | Admitting: Primary Care

## 2019-11-24 VITALS — BP 100/64 | HR 106 | Temp 96.8°F | Ht 60.0 in | Wt 95.2 lb

## 2019-11-24 DIAGNOSIS — R55 Syncope and collapse: Secondary | ICD-10-CM | POA: Insufficient documentation

## 2019-11-24 DIAGNOSIS — Z124 Encounter for screening for malignant neoplasm of cervix: Secondary | ICD-10-CM | POA: Insufficient documentation

## 2019-11-24 DIAGNOSIS — G121 Other inherited spinal muscular atrophy: Secondary | ICD-10-CM

## 2019-11-24 DIAGNOSIS — J45990 Exercise induced bronchospasm: Secondary | ICD-10-CM | POA: Diagnosis not present

## 2019-11-24 DIAGNOSIS — Z1151 Encounter for screening for human papillomavirus (HPV): Secondary | ICD-10-CM | POA: Insufficient documentation

## 2019-11-24 DIAGNOSIS — N946 Dysmenorrhea, unspecified: Secondary | ICD-10-CM

## 2019-11-24 DIAGNOSIS — R42 Dizziness and giddiness: Secondary | ICD-10-CM | POA: Diagnosis not present

## 2019-11-24 DIAGNOSIS — R519 Headache, unspecified: Secondary | ICD-10-CM

## 2019-11-24 MED ORDER — TOPIRAMATE 50 MG PO TABS: 50.0000 mg | ORAL_TABLET | Freq: Every day | ORAL | 0 refills | Status: DC

## 2019-11-24 NOTE — Patient Instructions (Addendum)
Start topiramate 50 mg once every evening for headache prevention. Please update me in a few weeks.  Stop by the lab prior to leaving today. I will notify you of your results once received.   You will be contacted regarding your referral to cardiology.  Please let us know if you have not been contacted within two weeks.   Ensure you are consuming 64 ounces of water daily.  It was a pleasure to see you today!

## 2019-11-24 NOTE — Assessment & Plan Note (Signed)
Acute since late 2020, four episodes total.  Labs pending including TSH, CMP, CBC. Orthostatic vitals negative. ECG today with NSR with rate of 68. No PAC/PVC, St changes. Appears similar to the ECG from 2017.  Referral placed to cardiology. Await labs.

## 2019-11-24 NOTE — Assessment & Plan Note (Signed)
Overall stable, infrequent use of albuterol inhaler. Continue to monitor.

## 2019-11-24 NOTE — Progress Notes (Signed)
Subjective:    Patient ID: Terry Newton, female    DOB: 08/06/2001, 19 y.o.   MRN: [id number]  HPI  This visit occurred during the SARS-CoV-2 public health emergency.  Safety protocols were in place, including screening questions prior to the visit, additional usage of staff PPE, and extensive cleaning of exam room while observing appropriate contact time as indicated for disinfecting solutions.   Terry Newton is a 19 year old female who presents today for follow up and medication refills. She would also like to discuss headaches and a recent syncope.  1) Birth Control: Due for pap smear, never completed before. She endorses normal monthly menstrual cycles without breakthrough bleeding. Doing well on OCP's.  2) Asthma: Overall doing well with her asthma, using albuterol inhaler infrequently.   3) Frequent Headaches: She would like to discuss daily headaches. Headaches located to bilateral frontal lobes that are daily lasting minutes to an hour.  She describes her headache episodes as throbbing with dizziness/lightheadedness, photophobia, nausea. She currently takes Ibuprofen and Tylenol with improvement.    She's never been on treatment for headaches.   4) Syncope: Intermittent with four total episodes that began in late 2020/early 2021. Episodes have occurred during rest, while sitting on the toilet, and during showers. Symptoms include feeling lightheaded, feeling dizzy, feeling weakness, seeing spots/visual disturbance, and then will wake up on the floor.  She can sense that these episodes will occur so she will lower herself to the floor.   Review of Systems  Eyes: Positive for photophobia.  Respiratory: Negative for shortness of breath and wheezing.   Cardiovascular: Negative for chest pain.  Neurological: Positive for syncope, light-headedness and headaches.       Past Medical History:  Diagnosis Date  . ADHD    On Ritalin, Vyvanse  . Asthma   . Depression   . GERD  (gastroesophageal reflux disease)   . Migraines   . SMA (spinal muscular atrophy) ([facility])   . Urinary retention    Extended bladder, holds urine for extended periods of time.     Social History   Socioeconomic History  . Marital status: Single    Spouse name: Not on file  . Number of children: Not on file  . Years of education: Not on file  . Highest education level: Not on file  Occupational History  . Not on file  Tobacco Use  . Smoking status: Never Smoker  . Smokeless tobacco: Never Used  Substance and Sexual Activity  . Alcohol use: No  . Drug use: No  . Sexual activity: Never    Birth control/protection: Abstinence    Comment: LMP this past weekend.  LMP prior was 65 days ago.  Periods are usually regular with only a little cramping.  Other Topics Concern  . Not on file  Social History Narrative   Birth history - Born by repeat c/s, no complications during pregnancy or delivery.  No sick contacts.  No thoughts of anxiety or self harm.  Lives with mother, father, 1 dog, no smoke exposure.  Patient was delayed at reaching developmental milestones, but once they were reached she has developed fine.      Single.   Attends [employer] and is studying Administration.    Enjoys spending time with boyfriend.    Social Determinants of Health   Financial Resource Strain:   . Difficulty of Paying Living Expenses: Not on file  Food Insecurity:   . Worried About Programme researcher, broadcasting/film/video in  the Last Year: Not on file  . Ran Out of Food in the Last Year: Not on file  Transportation Needs:   . Lack of Transportation (Medical): Not on file  . Lack of Transportation (Non-Medical): Not on file  Physical Activity:   . Days of Exercise per Week: Not on file  . Minutes of Exercise per Session: Not on file  Stress:   . Feeling of Stress : Not on file  Social Connections:   . Frequency of Communication with Friends and Family: Not on file  . Frequency of Social Gatherings with Friends and Family:  Not on file  . Attends Religious Services: Not on file  . Active Member of Clubs or Organizations: Not on file  . Attends Archivist Meetings: Not on file  . Marital Status: Not on file  Intimate Partner Violence:   . Fear of Current or Ex-Partner: Not on file  . Emotionally Abused: Not on file  . Physically Abused: Not on file  . Sexually Abused: Not on file    Past Surgical History:  Procedure Laterality Date  . CLUB FOOT RELEASE    . ESOPHAGOGASTRODUODENOSCOPY N/A 06/12/2016   Procedure: ESOPHAGOGASTRODUODENOSCOPY (EGD);  Surgeon: Joycelyn Rua, MD;  Location[facility] ENDOSCOPY;  Service: Endoscopy;  Laterality: N/A;  . KNEE SURGERY     Plates placed to bilateral knees to slow growth.    No family history on file.  Allergies  Allergen Reactions  . Oxycodone Nausea And Vomiting  . Pineapple Nausea And Vomiting    Current Outpatient Medications on File Prior to Visit  Medication Sig Dispense Refill  . AUROVELA FE 1/20 1-20 MG-MCG tablet TAKE 1 TABLET BY MOUTH DAILY 84 tablet 0  . VENTOLIN HFA 108 (90 Base) MCG/ACT inhaler INHALE 2 PUFFS INTO THE LUNGS EVERY 4 HOURS AS NEEDED FOR SHORTNESS OF BREATH 18 g 0  . ondansetron (ZOFRAN-ODT) 8 MG disintegrating tablet Take 1 tablet (8 mg total) by mouth every 8 (eight) hours as needed for nausea. (Patient not taking: Reported on 11/24/2019) 10 tablet 0   No current facility-administered medications on file prior to visit.    BP 100/64   Pulse (!) 106   Temp (!) 96.8 F (36 C)   Ht 5' (1.524 m)   Wt 95 lb 4 oz (43.2 kg)   LMP 11/13/2019   SpO2 100%   BMI 18.60 kg/m    Objective:   Physical Exam  Constitutional: She appears well-nourished.  Cardiovascular: Normal rate and regular rhythm.  Respiratory: Effort normal and breath sounds normal.  Musculoskeletal:     Cervical back: Neck supple.  Skin: Skin is warm and dry.  Psychiatric: She has a normal mood and affect.           Assessment & Plan:

## 2019-11-24 NOTE — Assessment & Plan Note (Signed)
Daily, with some migraine type symptoms. Discussed options for treatment and given her recent syncopal episodes we will avoid Propranolol.  Start Topamax 50 mg HS. She will update.

## 2019-11-24 NOTE — Assessment & Plan Note (Signed)
Much improved on OCP's Pap smear due today and completed today. Continue OCP's.

## 2019-11-24 NOTE — Assessment & Plan Note (Signed)
Overall stable, no continued weakness. Using braces without difficulty

## 2019-11-25 LAB — COMPREHENSIVE METABOLIC PANEL
AG Ratio: 1.7 (calc) (ref 1.0–2.5)
ALT: 11 U/L (ref 6–29)
AST: 12 U/L (ref 10–30)
Albumin: 4.5 g/dL (ref 3.6–5.1)
Alkaline phosphatase (APISO): 57 U/L (ref 31–125)
BUN: 9 mg/dL (ref 7–25)
CO2: 23 mmol/L (ref 20–32)
Calcium: 9.4 mg/dL (ref 8.6–10.2)
Chloride: 105 mmol/L (ref 98–110)
Creat: 0.54 mg/dL (ref 0.50–1.10)
Globulin: 2.7 g/dL (calc) (ref 1.9–3.7)
Glucose, Bld: 97 mg/dL (ref 65–99)
Potassium: 3.6 mmol/L (ref 3.5–5.3)
Sodium: 140 mmol/L (ref 135–146)
Total Bilirubin: 0.6 mg/dL (ref 0.2–1.2)
Total Protein: 7.2 g/dL (ref 6.1–8.1)

## 2019-11-25 LAB — CBC
HCT: 41.4 % (ref 35.0–45.0)
Hemoglobin: 14.2 g/dL (ref 11.7–15.5)
MCH: 29.5 pg (ref 27.0–33.0)
MCHC: 34.3 g/dL (ref 32.0–36.0)
MCV: 85.9 fL (ref 80.0–100.0)
MPV: 11.2 fL (ref 7.5–12.5)
Platelets: 274 10*3/uL (ref 140–400)
RBC: 4.82 10*6/uL (ref 3.80–5.10)
RDW: 12.6 % (ref 11.0–15.0)
WBC: 9.2 10*3/uL (ref 3.8–10.8)

## 2019-11-25 LAB — TSH: TSH: 1.97 mIU/L

## 2019-11-30 LAB — CYTOLOGY - PAP
Comment: NEGATIVE
Diagnosis: NEGATIVE
High risk HPV: NEGATIVE

## 2019-12-04 ENCOUNTER — Ambulatory Visit: Payer: BC Managed Care – PPO | Admitting: Cardiology

## 2019-12-11 ENCOUNTER — Encounter: Payer: Self-pay | Admitting: Cardiology

## 2019-12-11 ENCOUNTER — Ambulatory Visit (INDEPENDENT_AMBULATORY_CARE_PROVIDER_SITE_OTHER): Payer: Medicare Other

## 2019-12-11 ENCOUNTER — Other Ambulatory Visit: Payer: Self-pay

## 2019-12-11 ENCOUNTER — Ambulatory Visit (INDEPENDENT_AMBULATORY_CARE_PROVIDER_SITE_OTHER): Payer: Medicare Other | Admitting: Cardiology

## 2019-12-11 VITALS — BP 100/86 | HR 103 | Ht 60.0 in | Wt 96.5 lb

## 2019-12-11 DIAGNOSIS — R55 Syncope and collapse: Secondary | ICD-10-CM

## 2019-12-11 NOTE — Progress Notes (Signed)
Cardiology Office Note:    Date:  12/11/2019   ID:  Terry Newton, DOB 2001-06-24, MRN [id number]  PCP:  Doreene Nest, NP  Cardiologist:  Debbe Odea, MD  Electrophysiologist:  None   Referring MD: Doreene Nest, NP   Chief Complaint  Patient presents with  . New Patient (Initial Visit)    Pt states having dizziness when having a shower and a previous episode when using the restroom at home pt had a moment felt like fainting (syncope). Meds verbally reviewed w/ pt.   Terry Newton is a 19 y.o. female who is being seen today for the evaluation of syncope at the request of Doreene Nest, NP.   History of Present Illness:    Terry Newton is a 19 y.o. female with a hx of migraines, spinal muscular atrophy who presents due to syncope.  Patient states having 5 episodes of syncope which began 2 months ago.  Symptoms usually occur when patient is either taking a warm shower or just finished having a bowel movement.  She describes symptoms of dizziness, seeing bright spots prior to passing out.  Whenever she experiences the prodromes, she either sits down or lays on the ground to prevent falls.  She states passing out for 10 minutes usually.  She says what time she lays down by using her phone and then checks the time whenever she wakes up from passing out.  During the last episode, it lasted 30 minutes and her mom had to come check on her in the bathroom.  She denies any history of heart disease.  States she was born with a musculoskeletal abnormalities for which she uses crutches.  She denies chest pain, palpitations, shortness of breath with symptoms.  Past Medical History:  Diagnosis Date  . ADHD    On Ritalin, Vyvanse  . Asthma   . Depression   . GERD (gastroesophageal reflux disease)   . Migraines   . SMA (spinal muscular atrophy) (HCC)   . Urinary retention    Extended bladder, holds urine for extended periods of time.    Past Surgical  History:  Procedure Laterality Date  . CLUB FOOT RELEASE    . ESOPHAGOGASTRODUODENOSCOPY N/A 06/12/2016   Procedure: ESOPHAGOGASTRODUODENOSCOPY (EGD);  Surgeon: Adelene Amas, MD;  Location: [facility] ENDOSCOPY;  Service: Endoscopy;  Laterality: N/A;  . KNEE SURGERY     Plates placed to bilateral knees to slow growth.    Current Medications: Current Meds  Medication Sig  . AUROVELA FE 1/20 1-20 MG-MCG tablet TAKE 1 TABLET BY MOUTH DAILY  . topiramate (TOPAMAX) 50 MG tablet Take 1 tablet (50 mg total) by mouth at bedtime. For headaches.  . VENTOLIN HFA 108 (90 Base) MCG/ACT inhaler INHALE 2 PUFFS INTO THE LUNGS EVERY 4 HOURS AS NEEDED FOR SHORTNESS OF BREATH     Allergies:   Oxycodone and Pineapple   Social History   Socioeconomic History  . Marital status: Single    Spouse name: Not on file  . Number of children: Not on file  . Years of education: Not on file  . Highest education level: Not on file  Occupational History  . Not on file  Tobacco Use  . Smoking status: Never Smoker  . Smokeless tobacco: Never Used  Substance and Sexual Activity  . Alcohol use: No  . Drug use: No  . Sexual activity: Never    Birth control/protection: Abstinence    Comment: LMP this past weekend.  LMP  prior was 65 days ago.  Periods are usually regular with only a little cramping.  Other Topics Concern  . Not on file  Social History Narrative   Birth history - Born by repeat c/s, no complications during pregnancy or delivery.  No sick contacts.  No thoughts of anxiety or self harm.  Lives with mother, father, 1 dog, no smoke exposure.  Patient was delayed at reaching developmental milestones, but once they were reached she has developed fine.      Single.   Attends [employer] and is studying Administration.    Enjoys spending time with boyfriend.    Social Determinants of Health   Financial Resource Strain:   . Difficulty of Paying Living Expenses: Not on file  Food Insecurity:   . Worried About Community education officer in the Last Year: Not on file  . Ran Out of Food in the Last Year: Not on file  Transportation Needs:   . Lack of Transportation (Medical): Not on file  . Lack of Transportation (Non-Medical): Not on file  Physical Activity:   . Days of Exercise per Week: Not on file  . Minutes of Exercise per Session: Not on file  Stress:   . Feeling of Stress : Not on file  Social Connections:   . Frequency of Communication with Friends and Family: Not on file  . Frequency of Social Gatherings with Friends and Family: Not on file  . Attends Religious Services: Not on file  . Active Member of Clubs or Organizations: Not on file  . Attends Banker Meetings: Not on file  . Marital Status: Not on file     Family History: He denies any family history of heart disease.  ROS:   Please see the history of present illness.     All other systems reviewed and are negative.  EKGs/Labs/Other Studies Reviewed:    The following studies were reviewed today:   EKG:  EKG is  ordered today.  The ekg ordered today demonstrates sinus tachycardia, heart rate 103.  Recent Labs: 11/24/2019: ALT 11; BUN 9; Creat 0.54; Hemoglobin 14.2; Platelets 274; Potassium 3.6; Sodium 140; TSH 1.97  Recent Lipid Panel    Component Value Date/Time   TRIG 63 06/16/2016 0405    Physical Exam:    VS:  BP 100/86 (BP Location: Right Arm, Patient Position: Sitting, Cuff Size: Normal)   Pulse (!) 103   Ht 5' (1.524 m)   Wt 96 lb 8 oz (43.8 kg)   LMP 11/13/2019   SpO2 98%   BMI 18.85 kg/m     Wt Readings from Last 3 Encounters:  12/11/19 96 lb 8 oz (43.8 kg)  11/24/19 95 lb 4 oz (43.2 kg)  10/20/19 91 lb 12.8 oz (41.6 kg)     GEN:  Well nourished, well developed in no acute distress HEENT: Normal NECK: No JVD; No carotid bruits LYMPHATICS: No lymphadenopathy CARDIAC: Tachycardic, regular , no murmurs, rubs, gallops RESPIRATORY:  Clear to auscultation without rales, wheezing or rhonchi    ABDOMEN: Soft, non-tender, non-distended MUSCULOSKELETAL:  No edema;  SKIN: Warm and dry NEUROLOGIC:  Alert and oriented x 3 PSYCHIATRIC:  Normal affect   ASSESSMENT:    1. Syncope, unspecified syncope type    PLAN:    In order of problems listed above:  1. Patient with history of syncope.  The symptoms she describes such as warm shower and after bowel movement as suggestive of a neurogenic cause/vasovagal.  However, a  cardiac etiology cannot be ruled out.  We will get a cardiac monitor x2 weeks to evaluate for any arrhythmias.  Also get an echocardiogram to check any structural cardiac abnormalities especially with a history of musculoskeletal disease.  Low up after cardiac monitor and echocardiogram.  This note was generated in part or whole with voice recognition software. Voice recognition is usually quite accurate but there are transcription errors that can and very often do occur. I apologize for any typographical errors that were not detected and corrected.  Medication Adjustments/Labs and Tests Ordered: Current medicines are reviewed at length with the patient today.  Concerns regarding medicines are outlined above.  Orders Placed This Encounter  Procedures  . LONG TERM MONITOR (3-14 DAYS)  . EKG 12-Lead  . ECHOCARDIOGRAM COMPLETE   No orders of the defined types were placed in this encounter.   Patient Instructions  Medication Instructions:  Your physician recommends that you continue on your current medications as directed. Please refer to the Current Medication list given to you today.  *If you need a refill on your cardiac medications before your next appointment, please call your pharmacy*   Lab Work: None If you have labs (blood work) drawn today and your tests are completely normal, you will receive your results only by: Marland Kitchen MyChart Message (if you have MyChart) OR . A paper copy in the mail If you have any lab test that is abnormal or we need to change your  treatment, we will call you to review the results.   Testing/Procedures: Your physician has requested that you have an echocardiogram (after 12/25/19) . Echocardiography is a painless test that uses sound waves to create images of your heart. It provides your doctor with information about the size and shape of your heart and how well your heart's chambers and valves are working. This procedure takes approximately one hour. There are no restrictions for this procedure.  Your physician has recommended that you wear a 14 day Zio monitor, placed in office today. This monitor is a medical device that records the heart's electrical activity. Doctors most often use these monitors to diagnose arrhythmias. Arrhythmias are problems with the speed or rhythm of the heartbeat. The monitor is a small device applied to your chest. You can wear one while you do your normal daily activities. While wearing this monitor if you have any symptoms to push the button and record what you felt. Once you have worn this monitor for the period of time provider prescribed (Usually 14 days), you will return the monitor device in the postage paid box. Once it is returned they will download the data collected and provide [place] with a report which the provider will then review and we will call you with those results. Important tips:  1. Avoid showering during the first 24 hours of wearing the monitor. 2. Avoid excessive sweating to help maximize wear time. 3. Do not submerge the device, no hot tubs, and no swimming pools. 4. Keep any lotions or oils away from the patch. 5. After 24 hours you may shower with the patch on. Take brief showers with your back facing the shower head.  6. Do not remove patch once it has been placed because that will interrupt data and decrease adhesive wear time. 7. Push the button when you have any symptoms and write down what you were feeling. 8. Once you have completed wearing your monitor, remove and place  into box which has postage paid and place in  your outgoing mailbox.  9. If for some reason you have misplaced your box then call our office and we can provide another box and/or mail it off for you.           Follow-Up: At [facility], you and your health needs are our priority.  As part of our continuing mission to provide you with exceptional heart care, we have created designated Provider Care Teams.  These Care Teams include your primary Cardiologist (physician) and Advanced Practice Providers (APPs -  Physician Assistants and Nurse Practitioners) who all work together to provide you with the care you need, when you need it.  We recommend signing up for the patient portal called "MyChart".  Sign up information is provided on this After Visit Summary.  MyChart is used to connect with patients for Virtual Visits (Telemedicine).  Patients are able to view lab/test results, encounter notes, upcoming appointments, etc.  Non-urgent messages can be sent to your provider as well.   To learn more about what you can do with MyChart, go to [email or web address].    Your next appointment:   After testing   The format for your next appointment:   In Person  Provider:   Kate Sable, MD   Other Instructions  Echocardiogram An echocardiogram is a procedure that uses painless sound waves (ultrasound) to produce an image of the heart. Images from an echocardiogram can provide important information about:  Signs of coronary artery disease (CAD).  Aneurysm detection. An aneurysm is a weak or damaged part of an artery wall that bulges out from the normal force of blood pumping through the body.  Heart size and shape. Changes in the size or shape of the heart can be associated with certain conditions, including heart failure, aneurysm, and CAD.  Heart muscle function.  Heart valve function.  Signs of a past heart attack.  Fluid buildup around the heart.  Thickening of the  heart muscle.  A tumor or infectious growth around the heart valves. Tell a health care provider about:  Any allergies you have.  All medicines you are taking, including vitamins, herbs, eye drops, creams, and over-the-counter medicines.  Any blood disorders you have.  Any surgeries you have had.  Any medical conditions you have.  Whether you are pregnant or may be pregnant. What are the risks? Generally, this is a safe procedure. However, problems may occur, including:  Allergic reaction to dye (contrast) that may be used during the procedure. What happens before the procedure? No specific preparation is needed. You may eat and drink normally. What happens during the procedure?   An IV tube may be inserted into one of your veins.  You may receive contrast through this tube. A contrast is an injection that improves the quality of the pictures from your heart.  A gel will be applied to your chest.  A wand-like tool (transducer) will be moved over your chest. The gel will help to transmit the sound waves from the transducer.  The sound waves will harmlessly bounce off of your heart to allow the heart images to be captured in real-time motion. The images will be recorded on a computer. The procedure may vary among health care providers and hospitals. What happens after the procedure?  You may return to your normal, everyday life, including diet, activities, and medicines, unless your health care provider tells you not to do that. Summary  An echocardiogram is a procedure that uses painless sound waves (ultrasound) to produce an  image of the heart.  Images from an echocardiogram can provide important information about the size and shape of your heart, heart muscle function, heart valve function, and fluid buildup around your heart.  You do not need to do anything to prepare before this procedure. You may eat and drink normally.  After the echocardiogram is completed, you may  return to your normal, everyday life, unless your health care provider tells you not to do that. This information is not intended to replace advice given to you by your health care provider. Make sure you discuss any questions you have with your health care provider. Document Revised: 01/19/2019 Document Reviewed: 10/31/2016 Elsevier Patient Education  2020 [employer].      Signed, Debbe Odea, MD  12/11/2019 5:27 PM    [facility]

## 2019-12-11 NOTE — Patient Instructions (Addendum)
Medication Instructions:  Your physician recommends that you continue on your current medications as directed. Please refer to the Current Medication list given to you today.  *If you need a refill on your cardiac medications before your next appointment, please call your pharmacy*   Lab Work: None If you have labs (blood work) drawn today and your tests are completely normal, you will receive your results only by: Marland Kitchen MyChart Message (if you have MyChart) OR . A paper copy in the mail If you have any lab test that is abnormal or we need to change your treatment, we will call you to review the results.   Testing/Procedures: Your physician has requested that you have an echocardiogram (after 12/25/19) . Echocardiography is a painless test that uses sound waves to create images of your heart. It provides your doctor with information about the size and shape of your heart and how well your heart's chambers and valves are working. This procedure takes approximately one hour. There are no restrictions for this procedure.  Your physician has recommended that you wear a 14 day Zio monitor, placed in office today. This monitor is a medical device that records the heart's electrical activity. Doctors most often use these monitors to diagnose arrhythmias. Arrhythmias are problems with the speed or rhythm of the heartbeat. The monitor is a small device applied to your chest. You can wear one while you do your normal daily activities. While wearing this monitor if you have any symptoms to push the button and record what you felt. Once you have worn this monitor for the period of time provider prescribed (Usually 14 days), you will return the monitor device in the postage paid box. Once it is returned they will download the data collected and provide [place] with a report which the provider will then review and we will call you with those results. Important tips:  1. Avoid showering during the first 24 hours of  wearing the monitor. 2. Avoid excessive sweating to help maximize wear time. 3. Do not submerge the device, no hot tubs, and no swimming pools. 4. Keep any lotions or oils away from the patch. 5. After 24 hours you may shower with the patch on. Take brief showers with your back facing the shower head.  6. Do not remove patch once it has been placed because that will interrupt data and decrease adhesive wear time. 7. Push the button when you have any symptoms and write down what you were feeling. 8. Once you have completed wearing your monitor, remove and place into box which has postage paid and place in your outgoing mailbox.  9. If for some reason you have misplaced your box then call our office and we can provide another box and/or mail it off for you.           Follow-Up: At [facility], you and your health needs are our priority.  As part of our continuing mission to provide you with exceptional heart care, we have created designated Provider Care Teams.  These Care Teams include your primary Cardiologist (physician) and Advanced Practice Providers (APPs -  Physician Assistants and Nurse Practitioners) who all work together to provide you with the care you need, when you need it.  We recommend signing up for the patient portal called "MyChart".  Sign up information is provided on this After Visit Summary.  MyChart is used to connect with patients for Virtual Visits (Telemedicine).  Patients are able to view lab/test results, encounter  notes, upcoming appointments, etc.  Non-urgent messages can be sent to your provider as well.   To learn more about what you can do with MyChart, go to [email or web address].    Your next appointment:   After testing   The format for your next appointment:   In Person  Provider:   Kate Sable, MD   Other Instructions  Echocardiogram An echocardiogram is a procedure that uses painless sound waves (ultrasound) to produce an image of  the heart. Images from an echocardiogram can provide important information about:  Signs of coronary artery disease (CAD).  Aneurysm detection. An aneurysm is a weak or damaged part of an artery wall that bulges out from the normal force of blood pumping through the body.  Heart size and shape. Changes in the size or shape of the heart can be associated with certain conditions, including heart failure, aneurysm, and CAD.  Heart muscle function.  Heart valve function.  Signs of a past heart attack.  Fluid buildup around the heart.  Thickening of the heart muscle.  A tumor or infectious growth around the heart valves. Tell a health care provider about:  Any allergies you have.  All medicines you are taking, including vitamins, herbs, eye drops, creams, and over-the-counter medicines.  Any blood disorders you have.  Any surgeries you have had.  Any medical conditions you have.  Whether you are pregnant or may be pregnant. What are the risks? Generally, this is a safe procedure. However, problems may occur, including:  Allergic reaction to dye (contrast) that may be used during the procedure. What happens before the procedure? No specific preparation is needed. You may eat and drink normally. What happens during the procedure?   An IV tube may be inserted into one of your veins.  You may receive contrast through this tube. A contrast is an injection that improves the quality of the pictures from your heart.  A gel will be applied to your chest.  A wand-like tool (transducer) will be moved over your chest. The gel will help to transmit the sound waves from the transducer.  The sound waves will harmlessly bounce off of your heart to allow the heart images to be captured in real-time motion. The images will be recorded on a computer. The procedure may vary among health care providers and hospitals. What happens after the procedure?  You may return to your normal,  everyday life, including diet, activities, and medicines, unless your health care provider tells you not to do that. Summary  An echocardiogram is a procedure that uses painless sound waves (ultrasound) to produce an image of the heart.  Images from an echocardiogram can provide important information about the size and shape of your heart, heart muscle function, heart valve function, and fluid buildup around your heart.  You do not need to do anything to prepare before this procedure. You may eat and drink normally.  After the echocardiogram is completed, you may return to your normal, everyday life, unless your health care provider tells you not to do that. This information is not intended to replace advice given to you by your health care provider. Make sure you discuss any questions you have with your health care provider. Document Revised: 01/19/2019 Document Reviewed: 10/31/2016 Elsevier Patient Education  [place].

## 2020-01-19 ENCOUNTER — Ambulatory Visit (INDEPENDENT_AMBULATORY_CARE_PROVIDER_SITE_OTHER): Payer: Medicare Other

## 2020-01-19 ENCOUNTER — Other Ambulatory Visit: Payer: Self-pay

## 2020-01-19 DIAGNOSIS — R55 Syncope and collapse: Secondary | ICD-10-CM

## 2020-01-23 ENCOUNTER — Encounter: Payer: Self-pay | Admitting: Cardiology

## 2020-01-23 ENCOUNTER — Ambulatory Visit (INDEPENDENT_AMBULATORY_CARE_PROVIDER_SITE_OTHER): Payer: Medicare Other | Admitting: Cardiology

## 2020-01-23 ENCOUNTER — Other Ambulatory Visit: Payer: Self-pay

## 2020-01-23 VITALS — BP 106/60 | HR 109 | Ht 60.0 in | Wt 96.0 lb

## 2020-01-23 DIAGNOSIS — R55 Syncope and collapse: Secondary | ICD-10-CM | POA: Diagnosis not present

## 2020-01-23 NOTE — Patient Instructions (Signed)
Medication Instructions:  Your physician recommends that you continue on your current medications as directed. Please refer to the Current Medication list given to you today.  *If you need a refill on your cardiac medications before your next appointment, please call your pharmacy*   Lab Work: None ordered If you have labs (blood work) drawn today and your tests are completely normal, you will receive your results only by: . MyChart Message (if you have MyChart) OR . A paper copy in the mail If you have any lab test that is abnormal or we need to change your treatment, we will call you to review the results.   Testing/Procedures: None ordered   Follow-Up: At [facility], you and your health needs are our priority.  As part of our continuing mission to provide you with exceptional heart care, we have created designated Provider Care Teams.  These Care Teams include your primary Cardiologist (physician) and Advanced Practice Providers (APPs -  Physician Assistants and Nurse Practitioners) who all work together to provide you with the care you need, when you need it.  We recommend signing up for the patient portal called "MyChart".  Sign up information is provided on this After Visit Summary.  MyChart is used to connect with patients for Virtual Visits (Telemedicine).  Patients are able to view lab/test results, encounter notes, upcoming appointments, etc.  Non-urgent messages can be sent to your provider as well.   To learn more about what you can do with MyChart, go to [email or web address].    Your next appointment:   As needed   The format for your next appointment:   In Person  Provider:    You may see Brian Agbor-Etang, MD   or one of the following Advanced Practice Providers on your designated Care Team:    Christopher Berge, NP  Ryan Dunn, PA-C  Jacquelyn Visser, PA-C    Other Instructions N/A  

## 2020-01-23 NOTE — Progress Notes (Signed)
Cardiology Office Note:    Date:  01/23/2020   ID:  Terry Newton, DOB Sep 27, 2001, MRN [id number]  PCP:  Pleas Koch, NP  Cardiologist:  Kate Sable, MD  Electrophysiologist:  None   Referring MD: Pleas Koch, NP   Chief Complaint  Patient presents with  . Other    follow up post Zio. patient denies any sx. meds reviewed verbally with patient.      History of Present Illness:    Terry Newton is a 19 y.o. female with a hx of migraines, spinal muscular atrophy who presents for follow-up.  She was last seen due to syncope.  Symptoms were associated with warm showers or after having a bowel movement.  Neurogenic/vasovagal was high the etiology but cardiac etiology cannot be ruled out.  Echo and cardiac monitor was ordered.  Patient now presents for results.  Denies any symptoms, no new complaints.   Past Medical History:  Diagnosis Date  . ADHD    On Ritalin, Vyvanse  . Asthma   . Depression   . GERD (gastroesophageal reflux disease)   . Migraines   . SMA (spinal muscular atrophy) (Terry Newton)   . Urinary retention    Extended bladder, holds urine for extended periods of time.    Past Surgical History:  Procedure Laterality Date  . CLUB FOOT RELEASE    . ESOPHAGOGASTRODUODENOSCOPY N/A 06/12/2016   Procedure: ESOPHAGOGASTRODUODENOSCOPY (EGD);  Surgeon: Joycelyn Rua, MD;  Location: [facility] ENDOSCOPY;  Service: Endoscopy;  Laterality: N/A;  . KNEE SURGERY     Plates placed to bilateral knees to slow growth.    Current Medications: Current Meds  Medication Sig  . AUROVELA FE 1/20 1-20 MG-MCG tablet TAKE 1 TABLET BY MOUTH DAILY  . topiramate (TOPAMAX) 50 MG tablet Take 1 tablet (50 mg total) by mouth at bedtime. For headaches.  . VENTOLIN HFA 108 (90 Base) MCG/ACT inhaler INHALE 2 PUFFS INTO THE LUNGS EVERY 4 HOURS AS NEEDED FOR SHORTNESS OF BREATH     Allergies:   Oxycodone and Pineapple   Social History   Socioeconomic History  . Marital  status: Single    Spouse name: Not on file  . Number of children: Not on file  . Years of education: Not on file  . Highest education level: Not on file  Occupational History  . Not on file  Tobacco Use  . Smoking status: Never Smoker  . Smokeless tobacco: Never Used  Substance and Sexual Activity  . Alcohol use: No  . Drug use: No  . Sexual activity: Never    Birth control/protection: Abstinence    Comment: LMP this past weekend.  LMP prior was 65 days ago.  Periods are usually regular with only a little cramping.  Other Topics Concern  . Not on file  Social History Narrative   Birth history - Born by repeat c/s, no complications during pregnancy or delivery.  No sick contacts.  No thoughts of anxiety or self harm.  Lives with mother, father, 1 dog, no smoke exposure.  Patient was delayed at reaching developmental milestones, but once they were reached she has developed fine.      Single.   Attends [employer] and is studying Administration.    Enjoys spending time with boyfriend.    Social Determinants of Health   Financial Resource Strain:   . Difficulty of Paying Living Expenses:   Food Insecurity:   . Worried About Charity fundraiser in the Last Year:   .  Ran Out of Food in the Last Year:   Transportation Needs:   . Freight forwarder (Medical):   Marland Kitchen Lack of Transportation (Non-Medical):   Physical Activity:   . Days of Exercise per Week:   . Minutes of Exercise per Session:   Stress:   . Feeling of Stress :   Social Connections:   . Frequency of Communication with Friends and Family:   . Frequency of Social Gatherings with Friends and Family:   . Attends Religious Services:   . Active Member of Clubs or Organizations:   . Attends Banker Meetings:   Marland Kitchen Marital Status:      Family History: He denies any family history of heart disease.  ROS:   Please see the history of present illness.     All other systems reviewed and are  negative.  EKGs/Labs/Other Studies Reviewed:    The following studies were reviewed today:   EKG:  EKG is  ordered today.  The ekg ordered today demonstrates sinus tachycardia, heart rate 109.  Recent Labs: 11/24/2019: ALT 11; BUN 9; Creat 0.54; Hemoglobin 14.2; Platelets 274; Potassium 3.6; Sodium 140; TSH 1.97  Recent Lipid Panel    Component Value Date/Time   TRIG 63 06/16/2016 0405    Physical Exam:    VS:  BP 106/60 (BP Location: Right Arm, Patient Position: Sitting, Cuff Size: Normal)   Pulse (!) 109   Ht 5' (1.524 m)   Wt 96 lb (43.5 kg)   SpO2 99%   BMI 18.75 kg/m     Wt Readings from Last 3 Encounters:  01/23/20 96 lb (43.5 kg)  12/11/19 96 lb 8 oz (43.8 kg)  11/24/19 95 lb 4 oz (43.2 kg)     GEN:  Well nourished, well developed in no acute distress HEENT: Normal NECK: No JVD; No carotid bruits LYMPHATICS: No lymphadenopathy CARDIAC: Tachycardic, regular , no murmurs, rubs, gallops RESPIRATORY:  Clear to auscultation without rales, wheezing or rhonchi  ABDOMEN: Soft, non-tender, non-distended MUSCULOSKELETAL:  No edema;  SKIN: Warm and dry NEUROLOGIC:  Alert and oriented x 3 PSYCHIATRIC:  Normal affect   ASSESSMENT:    1. Syncope, unspecified syncope type    PLAN:    In order of problems listed above:  Patient with history of syncope.  2-week cardiac monitor did not show any significant arrhythmias.  Patient triggered events were associated with sinus rhythm or sinus tachycardia.  Overall benign monitor with no apparent cardiac etiology for syncope.  Echocardiogram shows normal systolic and diastolic function, LVEF 60 to 09%.  Patient reassured as cardiac etiology is not likely.  The symptoms she describes such as warm shower and after bowel movement as suggestive of a neurogenic cause/vasovagal.   Follow-up as needed  Total encounter time more than 35 minutes  Greater than 50% was spent in counseling and coordination of care with the patient Time  spent explaining to patient findings on cardiac monitor and echocardiogram.  Etiologies for syncope also explained.   This note was generated in part or whole with voice recognition software. Voice recognition is usually quite accurate but there are transcription errors that can and very often do occur. I apologize for any typographical errors that were not detected and corrected.  Medication Adjustments/Labs and Tests Ordered: Current medicines are reviewed at length with the patient today.  Concerns regarding medicines are outlined above.  Orders Placed This Encounter  Procedures  . EKG 12-Lead   No orders of the defined types  were placed in this encounter.   Patient Instructions  Medication Instructions:  Your physician recommends that you continue on your current medications as directed. Please refer to the Current Medication list given to you today.  *If you need a refill on your cardiac medications before your next appointment, please call your pharmacy*   Lab Work: None ordered If you have labs (blood work) drawn today and your tests are completely normal, you will receive your results only by: Marland Kitchen MyChart Message (if you have MyChart) OR . A paper copy in the mail If you have any lab test that is abnormal or we need to change your treatment, we will call you to review the results.   Testing/Procedures: None ordered   Follow-Up: At [facility], you and your health needs are our priority.  As part of our continuing mission to provide you with exceptional heart care, we have created designated Provider Care Teams.  These Care Teams include your primary Cardiologist (physician) and Advanced Practice Providers (APPs -  Physician Assistants and Nurse Practitioners) who all work together to provide you with the care you need, when you need it.  We recommend signing up for the patient portal called "MyChart".  Sign up information is provided on this After Visit Summary.  MyChart  is used to connect with patients for Virtual Visits (Telemedicine).  Patients are able to view lab/test results, encounter notes, upcoming appointments, etc.  Non-urgent messages can be sent to your provider as well.   To learn more about what you can do with MyChart, go to [email or web address].    Your next appointment:   As needed   The format for your next appointment:   In Person  Provider:    You may see Debbe Odea, MD or one of the following Advanced Practice Providers on your designated Care Team:    Nicolasa Ducking, NP  Eula Listen, PA-C  Marisue Ivan, PA-C    Other Instructions N/A     Signed, Debbe Odea, MD  01/23/2020 10:29 AM    [facility]

## 2020-01-31 ENCOUNTER — Other Ambulatory Visit: Payer: Self-pay | Admitting: Primary Care

## 2020-01-31 DIAGNOSIS — N946 Dysmenorrhea, unspecified: Secondary | ICD-10-CM

## 2020-05-25 ENCOUNTER — Other Ambulatory Visit: Payer: Self-pay

## 2020-05-25 ENCOUNTER — Encounter: Payer: Self-pay | Admitting: Emergency Medicine

## 2020-05-25 ENCOUNTER — Emergency Department
Admission: EM | Admit: 2020-05-25 | Discharge: 2020-05-25 | Disposition: A | Discharge status: Home / Self Care | Payer: Medicare Other | Attending: Emergency Medicine | Admitting: Emergency Medicine

## 2020-05-25 DIAGNOSIS — Z5321 Procedure and treatment not carried out due to patient leaving prior to being seen by health care provider: Secondary | ICD-10-CM | POA: Insufficient documentation

## 2020-05-25 DIAGNOSIS — R1110 Vomiting, unspecified: Secondary | ICD-10-CM | POA: Insufficient documentation

## 2020-05-25 LAB — CBC
HCT: 41.3 % (ref 36.0–46.0)
Hemoglobin: 14.3 g/dL (ref 12.0–15.0)
MCH: 29.3 pg (ref 26.0–34.0)
MCHC: 34.6 g/dL (ref 30.0–36.0)
MCV: 84.6 fL (ref 80.0–100.0)
Platelets: 276 10*3/uL (ref 150–400)
RBC: 4.88 MIL/uL (ref 3.87–5.11)
RDW: 12.6 % (ref 11.5–15.5)
WBC: 14.4 10*3/uL — ABNORMAL HIGH (ref 4.0–10.5)
nRBC: 0 % (ref 0.0–0.2)

## 2020-05-25 LAB — URINALYSIS, COMPLETE (UACMP) WITH MICROSCOPIC
Bilirubin Urine: NEGATIVE
Glucose, UA: NEGATIVE mg/dL
Ketones, ur: 5 mg/dL — AB
Nitrite: NEGATIVE
Protein, ur: >=300 mg/dL — AB
Specific Gravity, Urine: 1.02 (ref 1.005–1.030)
WBC, UA: >50 WBC/hpf — ABNORMAL HIGH (ref 0–5)
pH: 7 (ref 5.0–8.0)

## 2020-05-25 LAB — COMPREHENSIVE METABOLIC PANEL
ALT: 22 U/L (ref 0–44)
AST: 37 U/L (ref 15–41)
Albumin: 5 g/dL (ref 3.5–5.0)
Alkaline Phosphatase: 55 U/L (ref 38–126)
Anion gap: 18 — ABNORMAL HIGH (ref 5–15)
BUN: 14 mg/dL (ref 6–20)
CO2: 19 mmol/L — ABNORMAL LOW (ref 22–32)
Calcium: 10.2 mg/dL (ref 8.9–10.3)
Chloride: 103 mmol/L (ref 98–111)
Creatinine, Ser: 0.74 mg/dL (ref 0.44–1.00)
GFR calc Af Amer: >60 mL/min (ref >60)
GFR calc non Af Amer: >60 mL/min (ref >60)
Glucose, Bld: 142 mg/dL — ABNORMAL HIGH (ref 70–99)
Potassium: 3.5 mmol/L (ref 3.5–5.1)
Sodium: 140 mmol/L (ref 135–145)
Total Bilirubin: 1.8 mg/dL — ABNORMAL HIGH (ref 0.3–1.2)
Total Protein: 8.4 g/dL — ABNORMAL HIGH (ref 6.5–8.1)

## 2020-05-25 LAB — LIPASE, BLOOD: Lipase: 23 U/L (ref 11–51)

## 2020-05-25 MED ORDER — ONDANSETRON 4 MG PO TBDP
4.0000 mg | ORAL_TABLET | Freq: Once | ORAL | Status: AC | PRN
  Administered 2020-05-25: 4 mg via ORAL
  Filled 2020-05-25: qty 1

## 2020-05-25 NOTE — ED Notes (Signed)
Repeat VS obtained by this RN at this time. Pt visualized in NAD at this time. Pt A&O x4.

## 2020-05-25 NOTE — ED Triage Notes (Signed)
Pt to ED via POV c/o emesis since 0300. Pt denies diarrhea or fever. Pt is in NAD.

## 2020-05-25 NOTE — ED Notes (Signed)
Pt to registration desk, states she is going to leave, advised pt that she should stay to be evaluated, pt states she is going to follow up with her PCP. Informed patient if her PCP can access records that she should tell them to look at her chart.

## 2020-05-27 ENCOUNTER — Telehealth: Payer: Self-pay

## 2020-05-27 ENCOUNTER — Ambulatory Visit: Payer: Medicare Other | Admitting: Family Medicine

## 2020-05-27 ENCOUNTER — Other Ambulatory Visit: Payer: Self-pay

## 2020-05-27 ENCOUNTER — Encounter: Payer: Self-pay | Admitting: Family Medicine

## 2020-05-27 VITALS — BP 96/70 | HR 95 | Temp 96.2°F | Ht 60.0 in | Wt 91.2 lb

## 2020-05-27 DIAGNOSIS — R8290 Unspecified abnormal findings in urine: Secondary | ICD-10-CM | POA: Diagnosis not present

## 2020-05-27 DIAGNOSIS — N3090 Cystitis, unspecified without hematuria: Secondary | ICD-10-CM | POA: Diagnosis not present

## 2020-05-27 DIAGNOSIS — R112 Nausea with vomiting, unspecified: Secondary | ICD-10-CM | POA: Diagnosis not present

## 2020-05-27 LAB — POC URINALSYSI DIPSTICK (AUTOMATED)
Bilirubin, UA: NEGATIVE
Glucose, UA: NEGATIVE
Ketones, UA: NEGATIVE
Nitrite, UA: NEGATIVE
Protein, UA: NEGATIVE
Spec Grav, UA: 1.01 (ref 1.010–1.025)
Urobilinogen, UA: 1 E.U./dL
pH, UA: 6.5 (ref 5.0–8.0)

## 2020-05-27 MED ORDER — ONDANSETRON 8 MG PO TBDP: 8.0000 mg | ORAL_TABLET | Freq: Three times a day (TID) | ORAL | 0 refills | Status: DC | PRN

## 2020-05-27 NOTE — Telephone Encounter (Signed)
Per chart review tab pt has already seen Harlin Heys FNP today.

## 2020-05-27 NOTE — Telephone Encounter (Signed)
[facility] Night - Client TELEPHONE ADVICE RECORD [facility] Patient Name: Terry Newton Gender: Female DOB: 12/02/2000 Age: 19 Y 7 M 13 D Return Phone Number: [phone] (Primary) Address: City/State/Zip: [place]  [address] Client [facility] Night - Client Client Site [facility] - Night Physician Clark, Katherine - NP Contact Type Call Who Is Calling Patient / Member / Family / Caregiver Call Type Triage / Clinical Relationship To Patient Self Return Phone Number [phone] (Primary) Chief Complaint Vomiting Reason for Call Symptomatic / Request for Health Information Initial Comment Caller states she was vomiting all day Saturday and went to the ER and wasn't able to be seen. Caller states she did give the ER a urine sample that said she had a bladder infection. Caller states she is not currently having any symptoms. Translation No Nurse Assessment Nurse: May, RN, Tammy Date/Time (Eastern Time): 05/27/2020 8:47:45 AM Confirm and document reason for call. If symptomatic, describe symptoms. ---Caller states she went to the ER on Saturday for vomiting. Caller left without being seen. Caller states she was told she had a bladder infection. She did not get antibx. Caller states she was already called back and has an appt for 1015 this morning. Has the patient had close contact with a person known or suspected to have the novel coronavirus illness OR traveled / lives in area with major community spread (including international travel) in the last 14 days from the onset of symptoms? * If Asymptomatic, screen for exposure and travel within the last 14 days. ---No Does the patient have any new or worsening symptoms? ---No Disp. Time (Eastern Time) Disposition Final User 05/27/2020 8:50:32 AM Clinical Call Yes May, RN, Tammy 

## 2020-05-27 NOTE — Progress Notes (Signed)
Subjective:    Patient ID: Terry Newton, female    DOB: 20-Dec-2000, 19 y.o.   MRN: [id number]  HPI Chief Complaint  Patient presents with  . Emesis    on Saturday--went to ED but did not stay to be seen--was told about her urine may be UTI but delcine to stay   Three days ago ate [employer] a, awoke at 3 am with vomiting that was persistent. Unable to keep down anything for nausea. Went to ER, had urine and labs checked. Urine positive for blood/ leukocytes. Left before being seen. Mother did not get sick.  No dysuria, frequency, hematuria.  Has taken some ondansetron, helped. No diarrhea or constipation. No fever, back pain. Last bm today. Has been able to keep down ginger ale, gatorade, mashed potatoes, mac and cheese.    Review of Systems Per HPI    Objective:   Physical Exam Physical Exam  Constitutional: She is oriented to person, place, and time. She is thin. No distress.  HENT:  Head: Normocephalic and atraumatic.  Cardiovascular: Normal rate, regular rhythm and normal heart sounds.   Pulmonary/Chest: Effort normal and breath sounds normal.  Abdominal: Soft. She exhibits no distension. There is no tenderness. There is no rebound, no guarding and no CVA tenderness.  Neurological: She is alert and oriented to person, place, and time.  Skin: Skin is warm and dry. She is not diaphoretic.  Psychiatric: She has a normal mood and affect. Her behavior is normal. Judgment and thought content normal.  Vitals reviewed.     BP 96/70   Pulse 95   Temp (!) 96.2 F (35.7 C) (Temporal)   Ht 5' (1.524 m)   Wt 91 lb 4 oz (41.4 kg)   LMP 05/19/2020   SpO2 98%   BMI 17.82 kg/m  Wt Readings from Last 3 Encounters:  05/27/20 91 lb 4 oz (41.4 kg)  05/25/20 98 lb (44.5 kg)  01/23/20 96 lb (43.5 kg)   Results for orders placed or performed in visit on 05/27/20  POCT Urinalysis Dipstick (Automated)  Result Value Ref Range   Color, UA Yellow    Clarity, UA Cloudy     Glucose, UA Negative Negative   Bilirubin, UA Negative    Ketones, UA Negative    Spec Grav, UA 1.010 1.010 - 1.025   Blood, UA +-    pH, UA 6.5 5.0 - 8.0   Protein, UA Negative Negative   Urobilinogen, UA 1.0 0.2 or 1.0 E.U./dL   Nitrite, UA Negative    Leukocytes, UA Small (1+) (A) Negative       Assessment & Plan:  1. Cloudy urine - Urine Culture - POCT Urinalysis Dipstick (Automated)  2. Non-intractable vomiting with nausea, unspecified vomiting type - improved, will send in small amount ondansetron for prn use - advised to continue bland diet, good fluid intake - Urine Culture - POCT Urinalysis Dipstick (Automated) - ondansetron (ZOFRAN-ODT) 8 MG disintegrating tablet; Take 1 tablet (8 mg total) by mouth every 8 (eight) hours as needed for nausea.  Dispense: 20 tablet; Refill: 0  3. Abnormal urinalysis - will await culture results to determine whether antibiotic is needed - Urine Culture - POCT Urinalysis Dipstick (Automated)  This visit occurred during the SARS-CoV-2 public health emergency.  Safety protocols were in place, including screening questions prior to the visit, additional usage of staff PPE, and extensive cleaning of exam room while observing appropriate contact time as indicated for disinfecting solutions.  Clarene Reamer, FNP-BC  [facility]  05/27/2020 10:28 AM

## 2020-05-27 NOTE — Telephone Encounter (Signed)
Patient has already been seen. 

## 2020-05-27 NOTE — Patient Instructions (Signed)
Good to see you today  Good luck on your job search  I have sent in anti nausea medication if you need it- it can cause constipation  Continue bland diet and plenty of liquids  I will notify you of culture results and if an antibiotic is needed

## 2020-05-30 LAB — URINE CULTURE
MICRO NUMBER:: 10830885
SPECIMEN QUALITY:: ADEQUATE

## 2020-05-31 MED ORDER — SULFAMETHOXAZOLE-TRIMETHOPRIM 800-160 MG PO TABS: 1.0000 | ORAL_TABLET | Freq: Two times a day (BID) | ORAL | 0 refills | Status: DC

## 2020-05-31 NOTE — Addendum Note (Signed)
Addended by: Olean Ree B on: 05/31/2020 08:28 AM   Modules accepted: Orders

## 2020-11-07 ENCOUNTER — Other Ambulatory Visit: Payer: Self-pay

## 2020-11-07 ENCOUNTER — Emergency Department
Admission: EM | Admit: 2020-11-07 | Discharge: 2020-11-07 | Disposition: A | Discharge status: Home / Self Care | Payer: Medicare Other | Attending: Emergency Medicine | Admitting: Emergency Medicine

## 2020-11-07 ENCOUNTER — Encounter: Payer: Self-pay | Admitting: Emergency Medicine

## 2020-11-07 ENCOUNTER — Emergency Department: Payer: Medicare Other

## 2020-11-07 DIAGNOSIS — Z79899 Other long term (current) drug therapy: Secondary | ICD-10-CM | POA: Diagnosis not present

## 2020-11-07 DIAGNOSIS — J45909 Unspecified asthma, uncomplicated: Secondary | ICD-10-CM | POA: Insufficient documentation

## 2020-11-07 DIAGNOSIS — H5713 Ocular pain, bilateral: Secondary | ICD-10-CM | POA: Insufficient documentation

## 2020-11-07 DIAGNOSIS — R55 Syncope and collapse: Secondary | ICD-10-CM | POA: Insufficient documentation

## 2020-11-07 LAB — CBC
HCT: 43.9 % (ref 36.0–46.0)
Hemoglobin: 14.6 g/dL (ref 12.0–15.0)
MCH: 29.4 pg (ref 26.0–34.0)
MCHC: 33.3 g/dL (ref 30.0–36.0)
MCV: 88.5 fL (ref 80.0–100.0)
Platelets: 204 10*3/uL (ref 150–400)
RBC: 4.96 MIL/uL (ref 3.87–5.11)
RDW: 12.9 % (ref 11.5–15.5)
WBC: 7.1 10*3/uL (ref 4.0–10.5)
nRBC: 0 % (ref 0.0–0.2)

## 2020-11-07 LAB — URINALYSIS, COMPLETE (UACMP) WITH MICROSCOPIC
Bilirubin Urine: NEGATIVE
Glucose, UA: NEGATIVE mg/dL
Ketones, ur: NEGATIVE mg/dL
Nitrite: NEGATIVE
Protein, ur: 30 mg/dL — AB
Specific Gravity, Urine: 1.028 (ref 1.005–1.030)
WBC, UA: >50 WBC/hpf — ABNORMAL HIGH (ref 0–5)
pH: 5 (ref 5.0–8.0)

## 2020-11-07 LAB — BASIC METABOLIC PANEL
Anion gap: 13 (ref 5–15)
BUN: 17 mg/dL (ref 6–20)
CO2: 21 mmol/L — ABNORMAL LOW (ref 22–32)
Calcium: 9 mg/dL (ref 8.9–10.3)
Chloride: 104 mmol/L (ref 98–111)
Creatinine, Ser: 0.52 mg/dL (ref 0.44–1.00)
GFR, Estimated: >60 mL/min (ref >60)
Glucose, Bld: 117 mg/dL — ABNORMAL HIGH (ref 70–99)
Potassium: 3.1 mmol/L — ABNORMAL LOW (ref 3.5–5.1)
Sodium: 138 mmol/L (ref 135–145)

## 2020-11-07 LAB — POC URINE PREG, ED: Preg Test, Ur: NEGATIVE

## 2020-11-07 MED ORDER — POTASSIUM CHLORIDE ER 10 MEQ PO TBCR: 10.0000 meq | EXTENDED_RELEASE_TABLET | Freq: Every day | ORAL | 0 refills | Status: DC

## 2020-11-07 MED ORDER — NITROFURANTOIN MONOHYD MACRO 100 MG PO CAPS: 100.0000 mg | ORAL_CAPSULE | Freq: Two times a day (BID) | ORAL | 0 refills | Status: AC

## 2020-11-07 NOTE — Discharge Instructions (Signed)
Your work-up was reassuring except for it showed possible UTI. However you do not have symptoms do not need to take antibiotics. But if you develop symptoms you can start taking this. Your potassium was low and we are repeating this. I given you the number for neurology. Please call them to make a follow-up appointment. Also discussed with your primary care doctor to have this further worked up.  No driving for 4 weeks or until cleared by Dr.

## 2020-11-07 NOTE — ED Notes (Signed)
CT tech with pt
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
## 2020-11-07 NOTE — ED Notes (Signed)
Discharge instructions reviewed with pt and mother . Pt calm , collective .

## 2020-11-10 LAB — URINE CULTURE: Culture: >=100000 — AB

## 2020-11-20 ENCOUNTER — Ambulatory Visit: Payer: Medicare Other | Admitting: Primary Care

## 2020-11-25 ENCOUNTER — Encounter: Payer: Self-pay | Admitting: Primary Care

## 2020-11-25 ENCOUNTER — Ambulatory Visit (INDEPENDENT_AMBULATORY_CARE_PROVIDER_SITE_OTHER): Payer: Medicare Other | Admitting: Primary Care

## 2020-11-25 ENCOUNTER — Other Ambulatory Visit: Payer: Self-pay

## 2020-11-25 VITALS — BP 120/62 | HR 94 | Temp 98.6°F | Ht 60.0 in | Wt 93.0 lb

## 2020-11-25 DIAGNOSIS — R4020 Unspecified coma: Secondary | ICD-10-CM

## 2020-11-25 DIAGNOSIS — G43709 Chronic migraine without aura, not intractable, without status migrainosus: Secondary | ICD-10-CM | POA: Diagnosis not present

## 2020-11-25 DIAGNOSIS — N946 Dysmenorrhea, unspecified: Secondary | ICD-10-CM

## 2020-11-25 DIAGNOSIS — J45990 Exercise induced bronchospasm: Secondary | ICD-10-CM

## 2020-11-25 DIAGNOSIS — G121 Other inherited spinal muscular atrophy: Secondary | ICD-10-CM | POA: Diagnosis not present

## 2020-11-25 DIAGNOSIS — R519 Headache, unspecified: Secondary | ICD-10-CM

## 2020-11-25 MED ORDER — SUMATRIPTAN SUCCINATE 50 MG PO TABS: ORAL_TABLET | ORAL | 0 refills | Status: DC

## 2020-11-25 NOTE — Assessment & Plan Note (Signed)
Improved on OCP's, doing well. Continue same. Pap smear UTD.

## 2020-11-25 NOTE — Patient Instructions (Signed)
Please set up a visit with your neurologist as discussed. Let me know if you have trouble getting in.  You may take sumatriptan (Imitrex) 50 mg for migraine if needed. Take 1 tablet at migraine onset, may repeat 2 hours later if migraine persists.   It was a pleasure to see you today!
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
## 2020-11-25 NOTE — Assessment & Plan Note (Signed)
Improved overall on Topamax 50 mg daily. Continue same.  Rx for sumatriptan provided for migraine abortion PRN. Instructions provided.

## 2020-11-25 NOTE — Assessment & Plan Note (Signed)
Denies concerns, infrequent use of albuterol inhaler. Continue to monitor.

## 2020-11-25 NOTE — Assessment & Plan Note (Signed)
Improved and infrequent with Topamax 50 mg.  Sumatriptan 50 mg provided to use PRN for migraine abortion.
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
## 2020-11-25 NOTE — Assessment & Plan Note (Signed)
Due for follow up with Neurologist through [facility], she will call for an appointment.

## 2020-12-17 ENCOUNTER — Other Ambulatory Visit: Payer: Self-pay

## 2020-12-17 DIAGNOSIS — R0602 Shortness of breath: Secondary | ICD-10-CM

## 2020-12-19 MED ORDER — ALBUTEROL SULFATE HFA 108 (90 BASE) MCG/ACT IN AERS: 1.0000 | INHALATION_SPRAY | Freq: Four times a day (QID) | RESPIRATORY_TRACT | 0 refills | Status: DC | PRN

## 2020-12-19 NOTE — Telephone Encounter (Signed)
Refills sent to pharmacy. 

## 2020-12-19 NOTE — Telephone Encounter (Signed)
Ok to fill 

## 2020-12-19 NOTE — Telephone Encounter (Signed)
Ok to refill 

## 2021-02-21 ENCOUNTER — Other Ambulatory Visit: Payer: Self-pay | Admitting: Primary Care

## 2021-02-21 DIAGNOSIS — N946 Dysmenorrhea, unspecified: Secondary | ICD-10-CM

## 2021-02-21 MED ORDER — NORETHIN ACE-ETH ESTRAD-FE 1-20 MG-MCG PO TABS: 1.0000 | ORAL_TABLET | Freq: Every day | ORAL | 2 refills | Status: DC

## 2021-04-04 ENCOUNTER — Telehealth: Payer: Self-pay

## 2021-04-04 NOTE — Telephone Encounter (Signed)
See phone note

## 2021-04-04 NOTE — Telephone Encounter (Signed)
Agree that this needs evaluation, please thank her for the picture. We can get her in next week with somebody, or she may go to urgent care over the weekend.

## 2021-04-04 NOTE — Telephone Encounter (Signed)
Received faxed access nurse note; if comes into access nurse portal will fwd with this note. Starting on 03/29/21 scratched by dog on lt forearm and pt saw rash on lt forearm 03/31/21; today still has red rash with bumpy area about 4" long on arm.No drainage and does not feel warm to touch but area does itch. Pt put calamine lotion on rash last night which helped the itching. No soreness or pain. No fever. Pt prefers not to go to UC and no available appts at [facility]. Pt is going to send a picture of rash thru my chart and request cb after reviewed by Allayne Gitelman NP. [facility] [address]. Still no access nurse  note in portal. Will send copy of faxed note for scanning since still not in portal and will put copy in Allayne Gitelman NP in box.

## 2021-04-04 NOTE — Telephone Encounter (Signed)
Please see note under access nurse note.

## 2021-04-04 NOTE — Telephone Encounter (Signed)
[facility] Day - Client TELEPHONE ADVICE RECORD AccessNurse Patient Name: Terry Newton Gender: Female DOB: 07/17/2001 Age: 20 Y 5 M 21 D Return Phone Number: [phone] (Primary) Address: City/ State/ Zip: [place] [address] [facility] [employer] Day - Client Client Site [facility] - Day Physician Vernona Rieger - NP Contact Type Call Who Is Calling Patient / Member / Family / Caregiver Call Type Triage / Clinical Relationship To Patient Self Return Phone Number [phone] (Primary) Chief Complaint Rash - Localized Reason for Call Symptomatic / Request for Health Information Initial Comment Transferred from office, no appts. PT has a place on her arm that has bumps on it with itching. Translation No Nurse Assessment Nurse: Clarita Leber, RN, Deborah Date/Time (Eastern Time): 04/04/2021 9:41:02 AM Confirm and document reason for call. If symptomatic, describe symptoms. ---Caller has a rash on her arm that has bumps and is itching. Has a small line like a scratch. She states that it is red and itching. Does the patient have any new or worsening symptoms? ---Yes Will a triage be completed? ---Yes Related visit to physician within the last 2 weeks? ---No Does the PT have any chronic conditions? (i.e. diabetes, asthma, this includes High risk factors for pregnancy, etc.) ---Yes List chronic conditions. ---SMA Is the patient pregnant or possibly pregnant? (Ask all females between the ages of 32-55) ---No Is this a behavioral health or substance abuse call? ---No Guidelines Guideline Title Affirmed Question Affirmed Notes Nurse Date/Time (Eastern Time) Cuts and Lacerations Minor cut or scratch Womble, RN, Gavin Pound 04/04/2021 9:48:38 AM Disp. Time Lamount Cohen Time) Disposition Final User 04/04/2021 9:51:58 AM Home Care Yes Womble, RN, Deborah PLEASE NOTE: All timestamps contained within this report are  represented as [place] Standard Time. CONFIDENTIALTY NOTICE: This fax transmission is intended only for the addressee. It contains information that is legally privileged, confidential or otherwise protected from use or disclosure. If you are not the intended recipient, you are strictly prohibited from reviewing, disclosing, copying using or disseminating any of this information or taking any action in reliance on or regarding this information. If you have received this fax in error, please notify us immediately by telephone so that we can arrange for its return to [place]. Phone: [phone], Toll-Free: [phone], Fax: [phone] Page: 2 of 2 Call Id: [id number] Caller Disagree/Comply Comply Caller Understands Yes PreDisposition Home Care Care Advice Given Per Guideline * You become worse Comments User: Alita Chyle, RN Date/Time Lamount Cohen Time): 04/04/2021 9:54:28 AM During the call the caller states that her sister's dog scratched her and that is when she started having this reaction

## 2021-04-04 NOTE — Telephone Encounter (Signed)
Called patient she will go to urgent care over the weekend. If she changes her mind she will call our office for appointment.

## 2021-05-14 ENCOUNTER — Telehealth: Payer: Self-pay | Admitting: Primary Care

## 2021-05-14 NOTE — Telephone Encounter (Signed)
Spoke to patient by telephone and was advised that she started with symptoms Friday. Patient stated that she works at [employer] and has to go outside to work at times. Patient stated that it is hot in the building and even hotter when she goes outside. Patient denies headache or abdominal pain. Patient stated that she feels fine now and does not go to work until later today. Patient was advised to make sure that she drinks lots of fluids and stays hydrated. Patient was advised to keep her appointment as scheduled. Patient was given ER precautions and she verbalized understanding.

## 2021-05-14 NOTE — Telephone Encounter (Signed)
Noted, will evaluate. 

## 2021-05-14 NOTE — Telephone Encounter (Signed)
Pt scheduled appt for following via mychart. Please reach out to triage.   Appointment For: Terry Newton ([id number])  Visit Type: OFFICE VISIT (1004)    05/15/2021     8:20 AM  20 mins.  Doreene Nest, NP    [facility]    Patient Comments:  Not being able to eat much and the heat has been bothering me and makes me really lightheaded while I'm at work and every time I eat I end up throwing up. And I try drinking gatorade and water during the day but I can't eat like a full meal without feeling sick.

## 2021-05-15 ENCOUNTER — Encounter: Payer: Self-pay | Admitting: Primary Care

## 2021-05-15 ENCOUNTER — Other Ambulatory Visit: Payer: Self-pay

## 2021-05-15 ENCOUNTER — Ambulatory Visit (INDEPENDENT_AMBULATORY_CARE_PROVIDER_SITE_OTHER): Payer: Medicare Other | Admitting: Primary Care

## 2021-05-15 DIAGNOSIS — R110 Nausea: Secondary | ICD-10-CM | POA: Diagnosis not present

## 2021-05-15 NOTE — Progress Notes (Signed)
Subjective:    Patient ID: Terry Newton, female    DOB: [birth year], 20 y.o.   MRN: [id number]  HPI  Terry Newton is a very pleasant 20 y.o. female with a history of exercise induced asthma, neuromuscular weakness, syncope, dysmenorrhea who presents today to discuss nausea.   Her nausea began about one week ago and is intermittent, occurs without eating, and when exposed to the Summer heat. Symptoms will last for a few seconds, improved with laying down and resting.   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 Prolonged exposure to the heat provokes these symptoms of nausea, sitting and resting by a fan helps to improve symptoms. She's asked not to be placed by the door due to her symptoms, but her request has not been acknowledged.   She does not have nausea when in air conditioned space. She drinks 2-3 bottles of water while at work. She denies abdominal pain, vomiting. She has noticed some asthma symptoms of chest tightness and SOB with the increased heat and humidity.   She would like a work note to reassign her to a different role/area or one that is near a fan for one month until cooler weather comes. She denies menorrhagia, rectal bleeding, dizziness, syncope.    Review of Systems  Constitutional:  Negative for fever.  Respiratory:  Positive for chest tightness and shortness of breath. Negative for cough.   Gastrointestinal:  Negative for blood in stool.  Neurological:  Negative for dizziness, syncope, light-headedness and headaches.        Past Medical History:  Diagnosis Date   ADHD    On Ritalin, Vyvanse   Asthma    Depression    GERD (gastroesophageal reflux disease)    Migraines    SMA (spinal muscular atrophy) (HCC)    Urinary retention    Extended bladder, holds urine for extended periods of time.    Social History   Socioeconomic History   Marital status: Single    Spouse name: Not on file    Number of children: Not on file   Years of education: Not on file   Highest education level: Not on file  Occupational History   Not on file  Tobacco Use   Smoking status: Never   Smokeless tobacco: Never  Vaping Use   Vaping Use: Some days  Substance and Sexual Activity   Alcohol use: No   Drug use: No   Sexual activity: Never    Birth control/protection: Abstinence    Comment: LMP this past weekend.  LMP prior was 65 days ago.  Periods are usually regular with only a little cramping.  Other Topics Concern   Not on file  Social History Narrative   Birth history - Born by repeat c/s, no complications during pregnancy or delivery.  No sick contacts.  No thoughts of anxiety or self harm.  Lives with mother, father, 1 dog, no smoke exposure.  Patient was delayed at reaching developmental milestones, but once they were reached she has developed fine.      Single.   Attends [employer] and is studying Administration.    Enjoys spending time with boyfriend.    Social Determinants of Health   Financial Resource Strain: Not on file  Food Insecurity: Not on file  Transportation Needs: Not on file  Physical Activity: Not on file  Stress: Not on file  Social Connections: Not on file  Intimate Partner Violence: Not  on file    Past Surgical History:  Procedure Laterality Date   CLUB FOOT RELEASE     ESOPHAGOGASTRODUODENOSCOPY N/A 06/12/2016   Procedure: ESOPHAGOGASTRODUODENOSCOPY (EGD);  Surgeon: Adelene Amas, MD;  Location: [facility] ENDOSCOPY;  Service: Endoscopy;  Laterality: N/A;   KNEE SURGERY     Plates placed to bilateral knees to slow growth.    No family history on file.  Allergies  Allergen Reactions   Oxycodone Nausea And Vomiting   Amoxicillin Nausea And Vomiting   Pineapple Nausea And Vomiting    Current Outpatient Medications on File Prior to Visit  Medication Sig Dispense Refill   albuterol (VENTOLIN HFA) 108 (90 Base) MCG/ACT inhaler Inhale 1-2 puffs into the lungs every 6  (six) hours as needed for wheezing or shortness of breath. 18 g 0   norethindrone-ethinyl estradiol-FE (JUNEL FE 1/20) 1-20 MG-MCG tablet Take 1 tablet by mouth daily. 84 tablet 2   ondansetron (ZOFRAN-ODT) 8 MG disintegrating tablet Take 1 tablet (8 mg total) by mouth every 8 (eight) hours as needed for nausea. 20 tablet 0   SUMAtriptan (IMITREX) 50 MG tablet Take 1 tablet by mouth at migraine onset. May repeat in 2 hours if headache persists or recurs. 10 tablet 0   topiramate (TOPAMAX) 50 MG tablet Take 1 tablet (50 mg total) by mouth at bedtime. For headaches. 30 tablet 0   No current facility-administered medications on file prior to visit.    BP 110/68   Pulse 100   Temp 97.6 F (36.4 C) (Temporal)   Ht 5' (1.524 m)   Wt 87 lb (39.5 kg)   SpO2 99%   BMI 16.99 kg/m  Objective:   Physical Exam Constitutional:      General: She is not in acute distress.    Appearance: Normal appearance. She is not ill-appearing.  Cardiovascular:     Rate and Rhythm: Normal rate and regular rhythm.  Pulmonary:     Effort: Pulmonary effort is normal.     Breath sounds: Normal breath sounds. No wheezing.  Musculoskeletal:     Cervical back: Neck supple.  Skin:    General: Skin is warm and dry.  Neurological:     Mental Status: She is oriented to person, place, and time.  Psychiatric:        Mood and Affect: Mood normal.          Assessment & Plan:      This visit occurred during the SARS-CoV-2 public health emergency.  Safety protocols were in place, including screening questions prior to the visit, additional usage of staff PPE, and extensive cleaning of exam room while observing appropriate contact time as indicated for disinfecting solutions.

## 2021-05-15 NOTE — Assessment & Plan Note (Signed)
Acute and intermittent x 1 week, only occurs when standing/working near or in the heat.  Exam today benign.  No alarm signs in HPI.  Will provide work note to reassign her to another location/role for the next one month due to symptoms. She does not have symptoms when in air conditioned environment, days off, or when near a fan.

## 2021-05-15 NOTE — Patient Instructions (Signed)
Please notify me if your symptoms occur without heat exposure.  Continue to hydrate well with water.   It was a pleasure to see you today!

## 2021-06-07 ENCOUNTER — Ambulatory Visit
Admission: EM | Admit: 2021-06-07 | Discharge: 2021-06-07 | Disposition: A | Discharge status: Home / Self Care | Payer: Medicare Other | Attending: Family Medicine | Admitting: Family Medicine

## 2021-06-07 ENCOUNTER — Other Ambulatory Visit: Payer: Self-pay

## 2021-06-07 ENCOUNTER — Encounter: Payer: Self-pay | Admitting: Emergency Medicine

## 2021-06-07 DIAGNOSIS — J039 Acute tonsillitis, unspecified: Secondary | ICD-10-CM

## 2021-06-07 DIAGNOSIS — R1310 Dysphagia, unspecified: Secondary | ICD-10-CM

## 2021-06-07 LAB — POCT RAPID STREP A (OFFICE): Rapid Strep A Screen: NEGATIVE

## 2021-06-07 MED ORDER — LIDOCAINE VISCOUS HCL 2 % MT SOLN
15.0000 mL | OROMUCOSAL | 0 refills | Status: DC | PRN
Start: 2021-06-07 — End: 2021-10-21

## 2021-06-07 MED ORDER — PREDNISONE 5 MG/5ML PO SOLN
20.0000 mg | Freq: Every day | ORAL | 0 refills | Status: AC
Start: 2021-06-07 — End: 2021-06-12

## 2021-06-07 MED ORDER — SULFAMETHOXAZOLE-TRIMETHOPRIM 200-40 MG/5ML PO SUSP: 20.0000 mL | Freq: Two times a day (BID) | ORAL | 0 refills | Status: AC

## 2021-06-07 MED ORDER — LIDOCAINE VISCOUS HCL 2 % MT SOLN
15.0000 mL | OROMUCOSAL | Status: AC
  Administered 2021-06-07: 15 mL via OROMUCOSAL

## 2021-06-07 MED ORDER — DEXAMETHASONE SODIUM PHOSPHATE 10 MG/ML IJ SOLN
10.0000 mg | Freq: Once | INTRAMUSCULAR | Status: AC
  Administered 2021-06-07: 10 mg via INTRAMUSCULAR

## 2021-06-07 NOTE — ED Triage Notes (Signed)
Pt here with sore, itchy throat x 3 days with white patches and painful swallowing. No fever

## 2021-06-07 NOTE — ED Provider Notes (Signed)
Renaldo Fiddler    CSN: [id number] Arrival date & time: 06/07/21  1113      History   Chief Complaint Chief Complaint  Patient presents with   Sore Throat    HPI Terry Newton is a 20 y.o. female.   HPI Patient presents today for evaluation of sore throat.  Sore throat started yesterday.  Upon awakening this morning she had difficulty swallowing solids or fluid.  She has no history of recurrent strep.  Denies any known sick contacts.  Denies any oral sexual contact. No fever. Past Medical History:  Diagnosis Date   ADHD    On Ritalin, Vyvanse   Asthma    Depression    GERD (gastroesophageal reflux disease)    Migraines    SMA (spinal muscular atrophy) (HCC)    Urinary retention    Extended bladder, holds urine for extended periods of time.    Patient Active Problem List   Diagnosis Date Noted   Loss of consciousness (HCC) 11/25/2020   Chronic migraine without aura without status migrainosus, not intractable 11/25/2020   Syncope 11/24/2019   Nausea 03/22/2019   Frequent headaches 11/09/2018   Exercise-induced asthma 11/09/2018   Dysmenorrhea 01/18/2018   Autosomal dominant lower extremity predominant spinal muscular atrophy [id number] 06/13/2016   Constipation 06/13/2016   Neuromuscular weakness (HCC) 11/23/2011    Past Surgical History:  Procedure Laterality Date   CLUB FOOT RELEASE     ESOPHAGOGASTRODUODENOSCOPY N/A 06/12/2016   Procedure: ESOPHAGOGASTRODUODENOSCOPY (EGD);  Surgeon: Adelene Amas, MD;  Location: [facility] ENDOSCOPY;  Service: Endoscopy;  Laterality: N/A;   KNEE SURGERY     Plates placed to bilateral knees to slow growth.    OB History   No obstetric history on file.      Home Medications    Prior to Admission medications   Medication Sig Start Date End Date Taking? Authorizing Provider  lidocaine (XYLOCAINE) 2 % solution Use as directed 15 mLs in the mouth or throat as needed for mouth pain. 06/07/21  Yes Bing Neighbors, FNP   predniSONE 5 MG/5ML solution Take 20 mLs (20 mg total) by mouth daily with breakfast for 5 days. 06/07/21 06/12/21 Yes Bing Neighbors, FNP  sulfamethoxazole-trimethoprim (BACTRIM) 200-40 MG/5ML suspension Take 20 mLs by mouth 2 (two) times daily for 10 days. 06/07/21 06/17/21 Yes Bing Neighbors, FNP  albuterol (VENTOLIN HFA) 108 (90 Base) MCG/ACT inhaler Inhale 1-2 puffs into the lungs every 6 (six) hours as needed for wheezing or shortness of breath. 12/19/20   Doreene Nest, NP  norethindrone-ethinyl estradiol-FE (JUNEL FE 1/20) 1-20 MG-MCG tablet Take 1 tablet by mouth daily. 02/21/21   Doreene Nest, NP  ondansetron (ZOFRAN-ODT) 8 MG disintegrating tablet Take 1 tablet (8 mg total) by mouth every 8 (eight) hours as needed for nausea. 05/27/20   Emi Belfast, FNP  SUMAtriptan (IMITREX) 50 MG tablet Take 1 tablet by mouth at migraine onset. May repeat in 2 hours if headache persists or recurs. 11/25/20   Doreene Nest, NP  topiramate (TOPAMAX) 50 MG tablet Take 1 tablet (50 mg total) by mouth at bedtime. For headaches. 11/24/19   Doreene Nest, NP    Family History History reviewed. No pertinent family history.  Social History Social History   Tobacco Use   Smoking status: Never   Smokeless tobacco: Never  Vaping Use   Vaping Use: Some days  Substance Use Topics   Alcohol use: No   Drug use: No  Allergies   Oxycodone, Amoxicillin, and Pineapple   Review of Systems Review of Systems Pertinent negatives listed in HPI   Physical Exam Triage Vital Signs ED Triage Vitals  Enc Vitals Group     BP 06/07/21 1123 113/76     Pulse Rate 06/07/21 1123 (!) 106     Resp 06/07/21 1123 18     Temp 06/07/21 1123 98.8 F (37.1 C)     Temp Source 06/07/21 1123 Oral     SpO2 06/07/21 1123 98 %     Weight --      Height --      Head Circumference --      Peak Flow --      Pain Score 06/07/21 1132 8     Pain Loc --      Pain Edu? --      Excl. in GC? --     No data found.  Updated Vital Signs BP 113/76 (BP Location: Left Arm)   Pulse (!) 106   Temp 98.8 F (37.1 C) (Oral)   Resp 18   SpO2 98%   Visual Acuity Right Eye Distance:   Left Eye Distance:   Bilateral Distance:    Right Eye Near:   Left Eye Near:    Bilateral Near:     Physical Exam General Appearance:    Alert, cooperative, no distress  HENT:   Normocephalic,external ears normal,  neck has bilateral anterior cervical nodes enlarged and tonsils red, enlarged, with exudate present  Eyes:    PERRL, conjunctiva/corneas clear, EOM's intact       Lungs:     Clear to auscultation bilaterally, respirations unlabored  Heart:    Regular rate and rhythm  Neurologic:   Awake, alert, oriented x 3. No apparent focal neurological           defect.         UC Treatments / Results  Labs (all labs ordered are listed, but only abnormal results are displayed) Labs Reviewed  POCT RAPID STREP A (OFFICE) - Normal  CULTURE, GROUP A STREP [facility])    EKG   Radiology No results found.  Procedures Procedures (including critical care time)  Medications Ordered in UC Medications  dexamethasone (DECADRON) injection 10 mg (10 mg Intramuscular Given 06/07/21 1152)  lidocaine (XYLOCAINE) 2 % viscous mouth solution 15 mL (15 mLs Mouth/Throat Given 06/07/21 1152)    Initial Impression / Assessment and Plan / UC Course  I have reviewed the triage vital signs and the nursing notes.  Pertinent labs & imaging results that were available during my care of the patient were reviewed by me and considered in my medical decision making (see chart for details).    Acute Tonsillitis and Dysphagia  Decadron IM and Lidocaine viscous given here in clinic. Treatment per discharge instruction orders. RTC as needed Final Clinical Impressions(s) / UC Diagnoses   Final diagnoses:  Acute tonsillitis, unspecified etiology  Dysphagia, unspecified type   Discharge Instructions   None    ED  Prescriptions     Medication Sig Dispense Auth. Provider   lidocaine (XYLOCAINE) 2 % solution Use as directed 15 mLs in the mouth or throat as needed for mouth pain. 100 mL Bing Neighbors, FNP   predniSONE 5 MG/5ML solution Take 20 mLs (20 mg total) by mouth daily with breakfast for 5 days. 100 mL Bing Neighbors, FNP   sulfamethoxazole-trimethoprim (BACTRIM) 200-40 MG/5ML suspension Take 20 mLs by mouth 2 (two) times  daily for 10 days. 400 mL Bing Neighbors, FNP      PDMP not reviewed this encounter.   Bing Neighbors, FNP 06/07/21 1202

## 2021-06-08 ENCOUNTER — Other Ambulatory Visit
Admission: RE | Admit: 2021-06-08 | Discharge: 2021-06-08 | Disposition: A | Discharge status: Home / Self Care | Payer: Medicare Other | Source: Ambulatory Visit | Attending: Family Medicine | Admitting: Family Medicine

## 2021-06-08 DIAGNOSIS — J039 Acute tonsillitis, unspecified: Secondary | ICD-10-CM | POA: Insufficient documentation

## 2021-06-11 LAB — CULTURE, GROUP A STREP (THRC)

## 2021-06-29 ENCOUNTER — Other Ambulatory Visit: Payer: Self-pay

## 2021-06-29 ENCOUNTER — Emergency Department
Admission: EM | Admit: 2021-06-29 | Discharge: 2021-06-29 | Discharge status: Against Medical Advice | Payer: Medicare Other

## 2021-06-29 NOTE — ED Notes (Signed)
Pt seen leaving ER and getting into vehicle

## 2021-07-02 ENCOUNTER — Ambulatory Visit (INDEPENDENT_AMBULATORY_CARE_PROVIDER_SITE_OTHER): Payer: Medicare Other | Admitting: Primary Care

## 2021-07-02 ENCOUNTER — Other Ambulatory Visit: Payer: Self-pay

## 2021-07-02 VITALS — BP 108/64 | HR 100 | Temp 98.7°F | Ht 60.0 in | Wt 86.0 lb

## 2021-07-02 DIAGNOSIS — T7411XA Adult physical abuse, confirmed, initial encounter: Secondary | ICD-10-CM | POA: Insufficient documentation

## 2021-07-02 DIAGNOSIS — G43709 Chronic migraine without aura, not intractable, without status migrainosus: Secondary | ICD-10-CM | POA: Diagnosis not present

## 2021-07-02 DIAGNOSIS — R110 Nausea: Secondary | ICD-10-CM

## 2021-07-02 DIAGNOSIS — N946 Dysmenorrhea, unspecified: Secondary | ICD-10-CM

## 2021-07-02 DIAGNOSIS — J45990 Exercise induced bronchospasm: Secondary | ICD-10-CM

## 2021-07-02 DIAGNOSIS — R4020 Unspecified coma: Secondary | ICD-10-CM

## 2021-07-02 DIAGNOSIS — Z0000 Encounter for general adult medical examination without abnormal findings: Secondary | ICD-10-CM

## 2021-07-02 DIAGNOSIS — R519 Headache, unspecified: Secondary | ICD-10-CM

## 2021-07-02 NOTE — Assessment & Plan Note (Signed)
No recent migraines. No recent use of either Topamax or Imitrex.   Continue to monitor.

## 2021-07-02 NOTE — Patient Instructions (Signed)
It was a pleasure to see you today!  Please reach out of you need anything!  Preventive Care 35-22 Years Old, Female Preventive care refers to lifestyle choices and visits with your health care provider that can promote health and wellness. This includes: A yearly physical exam. This is also called an annual wellness visit. Regular dental and eye exams. Immunizations. Screening for certain conditions. Healthy lifestyle choices, such as: Eating a healthy diet. Getting regular exercise. Not using drugs or products that contain nicotine and tobacco. Limiting alcohol use. What can I expect for my preventive care visit? Physical exam Your health care provider may check your: Height and weight. These may be used to calculate your BMI (body mass index). BMI is a measurement that tells if you are at a healthy weight. Heart rate and blood pressure. Body temperature. Skin for abnormal spots. Counseling Your health care provider may ask you questions about your: Past medical problems. Family's medical history. Alcohol, tobacco, and drug use. Emotional well-being. Home life and relationship well-being. Sexual activity. Diet, exercise, and sleep habits. Work and work Statistician. Access to firearms. Method of birth control. Menstrual cycle. Pregnancy history. What immunizations do I need? Vaccines are usually given at various ages, according to a schedule. Your health care provider will recommend vaccines for you based on your age, medical history, and lifestyle or other factors, such as travel or where you work. What tests do I need? Blood tests Lipid and cholesterol levels. These may be checked every 5 years starting at age 22. Hepatitis C test. Hepatitis B test. Screening Diabetes screening. This is done by checking your blood sugar (glucose) after you have not eaten for a while (fasting). STD (sexually transmitted disease) testing, if you are at risk. BRCA-related cancer  screening. This may be done if you have a family history of breast, ovarian, tubal, or peritoneal cancers. Pelvic exam and Pap test. This may be done every 3 years starting at age 4. Starting at age 22, this may be done every 5 years if you have a Pap test in combination with an HPV test. Talk with your health care provider about your test results, treatment options, and if necessary, the need for more tests. Follow these instructions at home: Eating and drinking  Eat a healthy diet that includes fresh fruits and vegetables, whole grains, lean protein, and low-fat dairy products. Take vitamin and mineral supplements as recommended by your health care provider. Do not drink alcohol if: Your health care provider tells you not to drink. You are pregnant, may be pregnant, or are planning to become pregnant. If you drink alcohol: Limit how much you have to 0-1 drink a day. Be aware of how much alcohol is in your drink. In the [place] one drink equals one 12 oz bottle of beer (355 mL), one 5 oz glass of wine (148 mL), or one 1 oz glass of hard liquor (44 mL). Lifestyle Take daily care of your teeth and gums. Brush your teeth every morning and night with fluoride toothpaste. Floss one time each day. Stay active. Exercise for at least 30 minutes 5 or more days each week. Do not use any products that contain nicotine or tobacco, such as cigarettes, e-cigarettes, and chewing tobacco. If you need help quitting, ask your health care provider. Do not use drugs. If you are sexually active, practice safe sex. Use a condom or other form of protection to prevent STIs (sexually transmitted infections). If you do not wish to become pregnant,  use a form of birth control. If you plan to become pregnant, see your health care provider for a prepregnancy visit. Find healthy ways to cope with stress, such as: Meditation, yoga, or listening to music. Journaling. Talking to a trusted person. Spending time with friends  and family. Safety Always wear your seat belt while driving or riding in a vehicle. Do not drive: If you have been drinking alcohol. Do not ride with someone who has been drinking. When you are tired or distracted. While texting. Wear a helmet and other protective equipment during sports activities. If you have firearms in your house, make sure you follow all gun safety procedures. Seek help if you have been physically or sexually abused. What's next? Go to your health care provider once a year for an annual wellness visit. Ask your health care provider how often you should have your eyes and teeth checked. Stay up to date on all vaccines. This information is not intended to replace advice given to you by your health care provider. Make sure you discuss any questions you have with your health care provider. Document Revised: 12/06/2020 Document Reviewed: 06/09/2018 Elsevier Patient Education  2022 [employer].

## 2021-07-02 NOTE — Assessment & Plan Note (Signed)
Infrequent use of albuterol inhaler. No wheezing on exam.  Continue to monitor.
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
## 2021-07-02 NOTE — Assessment & Plan Note (Signed)
Tetanus UTD, declines influenza vaccine. Pap smear UTD.  Discussed safety for this age group including safe sexual practices, seat belt use, drug use.  She is in a safe home environment and separated from her parents. She feels safe.  Exam today stable.

## 2021-07-02 NOTE — Assessment & Plan Note (Signed)
No concerns. Continue OCP's daily.

## 2021-07-02 NOTE — Assessment & Plan Note (Signed)
Infrequent, no longer on Topamax. No recent use of Imitrex.  Continue to monitor.

## 2021-07-02 NOTE — Assessment & Plan Note (Signed)
Evaluated by neurology in March 2022, no episodes since February 2022. Negative work up at the time, suspected to be secondary to stress.

## 2021-07-07 DIAGNOSIS — R222 Localized swelling, mass and lump, trunk: Secondary | ICD-10-CM

## 2021-07-07 NOTE — Telephone Encounter (Signed)
Patient aware that you are out of the office and will address after return.   Discussed left side of chest pain at last visit. Wanted to let you know that she has started to have pain last night. Has not increased and only comes and goes. You had discussed if symptoms change to give [place] a calls so that we can set up ultrasound for evaluation. Patent would like to be scheduled in [place].   Have reviewed all red words and if any she will call us or go to ED/UC for evaluation.

## 2021-07-24 ENCOUNTER — Ambulatory Visit
Admission: RE | Admit: 2021-07-24 | Discharge: 2021-07-24 | Disposition: A | Discharge status: Home / Self Care | Payer: Medicare Other | Source: Ambulatory Visit | Attending: Primary Care | Admitting: Primary Care

## 2021-07-24 ENCOUNTER — Other Ambulatory Visit: Payer: Self-pay

## 2021-07-24 DIAGNOSIS — R222 Localized swelling, mass and lump, trunk: Secondary | ICD-10-CM | POA: Insufficient documentation

## 2021-08-03 ENCOUNTER — Telehealth: Payer: Self-pay | Admitting: Primary Care

## 2021-08-03 NOTE — Telephone Encounter (Signed)
Joellen or Ashtyn, is there a way to find out they delay of her ultrasound results? Looks like this was completed on 07/24/21.

## 2021-08-04 ENCOUNTER — Emergency Department: Payer: Medicare Other

## 2021-08-04 ENCOUNTER — Emergency Department
Admission: EM | Admit: 2021-08-04 | Discharge: 2021-08-04 | Disposition: A | Discharge status: Home / Self Care | Payer: Medicare Other | Attending: Student in an Organized Health Care Education/Training Program | Admitting: Student in an Organized Health Care Education/Training Program

## 2021-08-04 ENCOUNTER — Other Ambulatory Visit: Payer: Self-pay

## 2021-08-04 DIAGNOSIS — R55 Syncope and collapse: Secondary | ICD-10-CM | POA: Diagnosis not present

## 2021-08-04 DIAGNOSIS — D72829 Elevated white blood cell count, unspecified: Secondary | ICD-10-CM | POA: Diagnosis not present

## 2021-08-04 DIAGNOSIS — R630 Anorexia: Secondary | ICD-10-CM | POA: Diagnosis not present

## 2021-08-04 DIAGNOSIS — J45909 Unspecified asthma, uncomplicated: Secondary | ICD-10-CM | POA: Insufficient documentation

## 2021-08-04 LAB — CBC
HCT: 42.8 % (ref 36.0–46.0)
Hemoglobin: 14.8 g/dL (ref 12.0–15.0)
MCH: 30.1 pg (ref 26.0–34.0)
MCHC: 34.6 g/dL (ref 30.0–36.0)
MCV: 87 fL (ref 80.0–100.0)
Platelets: 279 10*3/uL (ref 150–400)
RBC: 4.92 MIL/uL (ref 3.87–5.11)
RDW: 12.9 % (ref 11.5–15.5)
WBC: 16.2 10*3/uL — ABNORMAL HIGH (ref 4.0–10.5)
nRBC: 0.1 % (ref 0.0–0.2)

## 2021-08-04 LAB — URINALYSIS, ROUTINE W REFLEX MICROSCOPIC
Bilirubin Urine: NEGATIVE
Glucose, UA: NEGATIVE mg/dL
Hgb urine dipstick: NEGATIVE
Ketones, ur: 5 mg/dL — AB
Nitrite: NEGATIVE
Protein, ur: 100 mg/dL — AB
Specific Gravity, Urine: 1.021 (ref 1.005–1.030)
pH: 8 (ref 5.0–8.0)

## 2021-08-04 LAB — BASIC METABOLIC PANEL
Anion gap: 9 (ref 5–15)
BUN: 14 mg/dL (ref 6–20)
CO2: 27 mmol/L (ref 22–32)
Calcium: 9.5 mg/dL (ref 8.9–10.3)
Chloride: 103 mmol/L (ref 98–111)
Creatinine, Ser: 0.54 mg/dL (ref 0.44–1.00)
GFR, Estimated: >60 mL/min (ref >60)
Glucose, Bld: 148 mg/dL — ABNORMAL HIGH (ref 70–99)
Potassium: 3.9 mmol/L (ref 3.5–5.1)
Sodium: 139 mmol/L (ref 135–145)

## 2021-08-04 LAB — POC URINE PREG, ED: Preg Test, Ur: NEGATIVE

## 2021-08-04 LAB — TROPONIN I (HIGH SENSITIVITY): Troponin I (High Sensitivity): <2 ng/L (ref <18)

## 2021-08-04 MED ORDER — LACTATED RINGERS IV BOLUS
1000.0000 mL | Freq: Once | INTRAVENOUS | Status: AC
  Administered 2021-08-04: 1000 mL via INTRAVENOUS

## 2021-08-04 NOTE — Telephone Encounter (Signed)
Spoke to Diane from radiology. She is going to bump as stat looks like they were trying to have reviewed as soon as possible.
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
## 2021-08-04 NOTE — ED Triage Notes (Signed)
Pt comes via EMs from work with c/o syncopal episode. Pt was sitting at desk watching a video on her phone when this happened. EMS reports pt was pale and vomited, CBG-146, VSS 20 g LAC

## 2021-08-05 NOTE — Telephone Encounter (Signed)
Noted, will await results.  Thanks for checking.

## 2021-08-19 ENCOUNTER — Ambulatory Visit (INDEPENDENT_AMBULATORY_CARE_PROVIDER_SITE_OTHER): Payer: Medicare Other

## 2021-08-19 ENCOUNTER — Ambulatory Visit
Admission: EM | Admit: 2021-08-19 | Discharge: 2021-08-19 | Disposition: A | Discharge status: Home / Self Care | Payer: Medicare Other | Attending: Internal Medicine | Admitting: Internal Medicine

## 2021-08-19 ENCOUNTER — Encounter: Payer: Self-pay | Admitting: Emergency Medicine

## 2021-08-19 DIAGNOSIS — M25551 Pain in right hip: Secondary | ICD-10-CM

## 2021-08-19 DIAGNOSIS — S7001XA Contusion of right hip, initial encounter: Secondary | ICD-10-CM

## 2021-08-19 MED ORDER — IBUPROFEN 600 MG PO TABS: 600.0000 mg | ORAL_TABLET | Freq: Three times a day (TID) | ORAL | 0 refills | Status: DC

## 2021-08-19 NOTE — ED Triage Notes (Signed)
Pt presents with right hip pain x 9 days after falling on her ramp at home. Pt states the pain has gotten worse.

## 2021-08-19 NOTE — Discharge Instructions (Signed)
-   Follow up with your primary care doctor in one week.
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
## 2021-08-26 ENCOUNTER — Emergency Department
Admission: EM | Admit: 2021-08-26 | Discharge: 2021-08-26 | Disposition: A | Discharge status: Home / Self Care | Payer: Medicare Other | Attending: Emergency Medicine | Admitting: Emergency Medicine

## 2021-08-26 ENCOUNTER — Other Ambulatory Visit: Payer: Self-pay

## 2021-08-26 DIAGNOSIS — R42 Dizziness and giddiness: Secondary | ICD-10-CM | POA: Diagnosis not present

## 2021-08-26 DIAGNOSIS — Z5321 Procedure and treatment not carried out due to patient leaving prior to being seen by health care provider: Secondary | ICD-10-CM | POA: Insufficient documentation

## 2021-08-26 DIAGNOSIS — R55 Syncope and collapse: Secondary | ICD-10-CM | POA: Diagnosis present

## 2021-08-26 DIAGNOSIS — R110 Nausea: Secondary | ICD-10-CM | POA: Diagnosis not present

## 2021-08-26 DIAGNOSIS — R61 Generalized hyperhidrosis: Secondary | ICD-10-CM | POA: Insufficient documentation

## 2021-08-26 LAB — COMPREHENSIVE METABOLIC PANEL
ALT: 20 U/L (ref 0–44)
AST: 21 U/L (ref 15–41)
Albumin: 4.4 g/dL (ref 3.5–5.0)
Alkaline Phosphatase: 54 U/L (ref 38–126)
Anion gap: 9 (ref 5–15)
BUN: 14 mg/dL (ref 6–20)
CO2: 23 mmol/L (ref 22–32)
Calcium: 9.5 mg/dL (ref 8.9–10.3)
Chloride: 106 mmol/L (ref 98–111)
Creatinine, Ser: 0.52 mg/dL (ref 0.44–1.00)
GFR, Estimated: >60 mL/min (ref >60)
Glucose, Bld: 119 mg/dL — ABNORMAL HIGH (ref 70–99)
Potassium: 3.7 mmol/L (ref 3.5–5.1)
Sodium: 138 mmol/L (ref 135–145)
Total Bilirubin: 0.9 mg/dL (ref 0.3–1.2)
Total Protein: 7.6 g/dL (ref 6.5–8.1)

## 2021-08-26 LAB — CBC WITH DIFFERENTIAL/PLATELET
Abs Immature Granulocytes: 0.13 10*3/uL — ABNORMAL HIGH (ref 0.00–0.07)
Basophils Absolute: 0.1 10*3/uL (ref 0.0–0.1)
Basophils Relative: 1 %
Eosinophils Absolute: 0.2 10*3/uL (ref 0.0–0.5)
Eosinophils Relative: 1 %
HCT: 42.7 % (ref 36.0–46.0)
Hemoglobin: 14.9 g/dL (ref 12.0–15.0)
Immature Granulocytes: 1 %
Lymphocytes Relative: 16 %
Lymphs Abs: 3.1 10*3/uL (ref 0.7–4.0)
MCH: 29.6 pg (ref 26.0–34.0)
MCHC: 34.9 g/dL (ref 30.0–36.0)
MCV: 84.9 fL (ref 80.0–100.0)
Monocytes Absolute: 0.7 10*3/uL (ref 0.1–1.0)
Monocytes Relative: 4 %
Neutro Abs: 15.1 10*3/uL — ABNORMAL HIGH (ref 1.7–7.7)
Neutrophils Relative %: 77 %
Platelets: 306 10*3/uL (ref 150–400)
RBC: 5.03 MIL/uL (ref 3.87–5.11)
RDW: 12.5 % (ref 11.5–15.5)
WBC: 19.4 10*3/uL — ABNORMAL HIGH (ref 4.0–10.5)
nRBC: 0 % (ref 0.0–0.2)

## 2021-08-26 MED ORDER — ONDANSETRON 4 MG PO TBDP
ORAL_TABLET | ORAL | Status: AC
  Administered 2021-08-26: 4 mg via ORAL
  Filled 2021-08-26: qty 1

## 2021-08-26 MED ORDER — ONDANSETRON 4 MG PO TBDP: 4.0000 mg | ORAL_TABLET | Freq: Once | ORAL | Status: AC

## 2021-08-26 NOTE — ED Provider Notes (Signed)
Emergency Medicine Provider Triage Evaluation Note  Terry Newton , a 20 y.o. female  was evaluated in triage.  Pt complains of presents to the emergency department after patient had an episode of syncope at work.  Patient reports that she was standing for prolonged period of time and had not had anything to eat today.  She states that she became lightheaded and diaphoretic and blacked out momentarily.  She states that afterwards she felt nauseated.  She states that she has a history of syncope in the past.  No current chest pain, chest tightness, shortness of breath or headache.  Review of Systems  Positive: Patient had dizziness.  Negative: No chest pain, chest tightness or abdominal pain.   Physical Exam  Temp 98.6 F (37 C) (Oral)   Resp 18   Ht 5' (1.524 m)   Wt 38.1 kg   LMP 08/12/2021   SpO2 100%   BMI 16.41 kg/m  Gen:   Awake, no distress   Resp:  Normal effort  MSK:   Moves extremities without difficulty  Other:    Medical Decision Making  Medically screening exam initiated at 5:33 PM.  Appropriate orders placed.  Terry Newton was informed that the remainder of the evaluation will be completed by another provider, this initial triage assessment does not replace that evaluation, and the importance of remaining in the ED until their evaluation is complete.     Pia Mau [place], PA-C 08/26/21 1735    [name], MD 08/26/21 2122

## 2021-08-26 NOTE — ED Triage Notes (Signed)
Pt comes into the Ed via EMS from work after having a syncopal episode for a few seconds,  [id number] [id number]

## 2021-08-28 ENCOUNTER — Telehealth: Payer: Self-pay | Admitting: Primary Care

## 2021-08-28 NOTE — Telephone Encounter (Signed)
Please call patient:  I was notified by a nurse in the emergency department that her labs were abnormal.  We need to see her in the office for follow-up as soon as possible.  How is she feeling? Any symptoms?

## 2021-08-29 ENCOUNTER — Other Ambulatory Visit: Payer: Self-pay

## 2021-08-29 ENCOUNTER — Encounter: Payer: Self-pay | Admitting: Primary Care

## 2021-08-29 ENCOUNTER — Ambulatory Visit (INDEPENDENT_AMBULATORY_CARE_PROVIDER_SITE_OTHER): Payer: Medicare Other | Admitting: Primary Care

## 2021-08-29 VITALS — BP 90/78 | HR 125 | Temp 98.4°F | Ht 60.0 in | Wt 90.4 lb

## 2021-08-29 DIAGNOSIS — R55 Syncope and collapse: Secondary | ICD-10-CM

## 2021-08-29 DIAGNOSIS — R8290 Unspecified abnormal findings in urine: Secondary | ICD-10-CM

## 2021-08-29 DIAGNOSIS — D72829 Elevated white blood cell count, unspecified: Secondary | ICD-10-CM | POA: Diagnosis not present

## 2021-08-29 LAB — CBC WITH DIFFERENTIAL/PLATELET
Basophils Absolute: 0.1 10*3/uL (ref 0.0–0.1)
Basophils Relative: 1.1 % (ref 0.0–3.0)
Eosinophils Absolute: 0.2 10*3/uL (ref 0.0–0.7)
Eosinophils Relative: 1.8 % (ref 0.0–5.0)
HCT: 41.8 % (ref 36.0–46.0)
Hemoglobin: 14.2 g/dL (ref 12.0–15.0)
Lymphocytes Relative: 28.5 % (ref 12.0–46.0)
Lymphs Abs: 3 10*3/uL (ref 0.7–4.0)
MCHC: 33.9 g/dL (ref 30.0–36.0)
MCV: 85 fl (ref 78.0–100.0)
Monocytes Absolute: 0.6 10*3/uL (ref 0.1–1.0)
Monocytes Relative: 5.2 % (ref 3.0–12.0)
Neutro Abs: 6.7 10*3/uL (ref 1.4–7.7)
Neutrophils Relative %: 63.4 % (ref 43.0–77.0)
Platelets: 253 10*3/uL (ref 150.0–400.0)
RBC: 4.92 Mil/uL (ref 3.87–5.11)
RDW: 13.2 % (ref 11.5–15.5)
WBC: 10.5 10*3/uL (ref 4.0–10.5)

## 2021-08-29 LAB — POC URINALSYSI DIPSTICK (AUTOMATED)
Bilirubin, UA: NEGATIVE
Blood, UA: NEGATIVE
Glucose, UA: NEGATIVE
Ketones, UA: NEGATIVE
Nitrite, UA: NEGATIVE
Protein, UA: POSITIVE — AB
Spec Grav, UA: 1.015 (ref 1.010–1.025)
Urobilinogen, UA: 0.2 E.U./dL
pH, UA: 8 (ref 5.0–8.0)

## 2021-08-29 LAB — BASIC METABOLIC PANEL
BUN: 11 mg/dL (ref 6–23)
CO2: 27 mEq/L (ref 19–32)
Calcium: 9.7 mg/dL (ref 8.4–10.5)
Chloride: 105 mEq/L (ref 96–112)
Creatinine, Ser: 0.75 mg/dL (ref 0.40–1.20)
GFR: 112.65 mL/min (ref >60.00)
Glucose, Bld: 91 mg/dL (ref 70–99)
Potassium: 3.9 mEq/L (ref 3.5–5.1)
Sodium: 141 mEq/L (ref 135–145)

## 2021-08-29 LAB — POCT URINE PREGNANCY: Preg Test, Ur: NEGATIVE

## 2021-08-29 NOTE — Telephone Encounter (Signed)
Patient was seen in office today.  

## 2021-08-29 NOTE — Assessment & Plan Note (Addendum)
Chronic, intermittent, continued.   Reviewed cardiology notes from 2021 and latest neurology notes from 2022 as well as all ED notes.  Given leukocytosis with left shift, coupled with very foul smelling urine will check UA with culture, CBC with diff, BMP. Recent episodes could very well be secondary to cystitis. Will treat with antibiotics if warranted.   Urine pregnancy test negative. Negative orthostatic vital signs.  Strongly advised she work on hydration with 64 ounces of water, low calorie Gatorade, Pedialyte .   Await results, but if negative recommended she touch base again with her neurologist.

## 2021-08-29 NOTE — Progress Notes (Signed)
Subjective:    Patient ID: Terry Newton, female    DOB: 03-17-2001, 20 y.o.   MRN: [id number]  HPI  Terry Newton is a very pleasant 20 y.o. female with a history of recurrent syncope, acute cystitis chronic migraines, spinal muscular atrophy, who presents today to discuss syncope. She is with her roommate which is an older female.   She was evaluated at [facility] ED in the triage area on 08/26/21 for syncope that occurred while at work. She was standing for a long time, hadn't eaten, became lightheaded and sweaty, nauseated, and "blacked out momentarily". She was screened in triage with labs which revealed leukocytosis of 19.4 with left shift.  Evaluated at [facility] ED on 08/04/21 for loss of consciousness. She experienced a fainting spell while at work while watching a training video, felt well before her spell, did shake for a few minutes per a witness. As soon as she woke she felt nauseated. She endorsed not eating much that day due to a poor appetite. She underwent ECG (no acute process), labs showed leukocytosis with WBC count of 16.2, UA with rare bacteria, negative troponin. She was treated with IV fluids and felt better. Exam and HPI non consistent for seizure so she was discharged home.   She has seen cardiology, March and April 2021 for syncope that was suspected to be secondary to neurogenic/vasovagal etiology. She wore a Zio heart monitor for 2 weeks which did not show significant arrhythmias (some sinus tach). She underwent echocardiogram which showed LVEF of 60-65%, normal systolic and diastolic function. She was reassured by cardiology that her symptoms were not cardiac in cause.   Today she endorses a decreased appetite, will sometimes go all day without eating, she does snack on a few things. She is living with two new roommates, older males, who have been trying to encourage her to eat and drink. She never falls or hits her head before syncopal episodes.   She eats 4-5  bites of each meal.   Breakfast: Eggs, toast, biscuit, ham or sausage. Biggest meal of the day. Lunch: Sub sandwich, take out food, skips a few days weekly Dinner: Meat, starch  Snacks: Crackers Desserts: Infrequently  Beverages: Boost, juice, water (16-32 oz daily), chocolate milk, milk.   She denies dysuria, urinary frequency, hematuria, foul smelling urine, cough, sinus pressure, fevers, abdominal pain. She does have a history of cystitis. Today she feels very tired and weak. She's been sleeping a lot more throughout the day which is not usual for her.   She saw her neurologist in March 2022 who completed EEG which was normal. He did not suspect seizure activity to be the cause of her fainting. He completed labs which were grossly normal. He encouraged her to work on hydration and snacking.   She works at [employer], works at [employer], [employer] at the same. Will stand 6-7 hours at a time sometimes. 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   Review of Systems  Constitutional:  Positive for appetite change. Negative for chills and fever.  Respiratory:  Negative for cough and shortness of breath.   Cardiovascular:  Negative for chest pain.  Gastrointestinal:  Positive for nausea. Negative for abdominal pain, constipation, diarrhea and vomiting.  Genitourinary:  Negative for dysuria, frequency, hematuria and vaginal discharge.  Neurological:  Positive for syncope. Negative for dizziness and headaches.        Past Medical History:  Diagnosis  Date   ADHD    On Ritalin, Vyvanse   Asthma    Depression    GERD (gastroesophageal reflux disease)    Migraines    SMA (spinal muscular atrophy) ([place])    Urinary retention    Extended bladder, holds urine for extended periods of time.    Social History   Socioeconomic History   Marital status: Single    Spouse name: Not on file   Number of children: Not on file   Years of education:  Not on file   Highest education level: Not on file  Occupational History   Not on file  Tobacco Use   Smoking status: Never   Smokeless tobacco: Never  Vaping Use   Vaping Use: Some days  Substance and Sexual Activity   Alcohol use: No   Drug use: No   Sexual activity: Never    Birth control/protection: Abstinence    Comment: LMP this past weekend.  LMP prior was 65 days ago.  Periods are usually regular with only a little cramping.  Other Topics Concern   Not on file  Social History Narrative   Birth history - Born by repeat c/s, no complications during pregnancy or delivery.  No sick contacts.  No thoughts of anxiety or self harm.  Lives with mother, father, 1 dog, no smoke exposure.  Patient was delayed at reaching developmental milestones, but once they were reached she has developed fine.      Single.   Attends [employer] and is studying Administration.    Enjoys spending time with boyfriend.    Social Determinants of Health   Financial Resource Strain: Not on file  Food Insecurity: Not on file  Transportation Needs: Not on file  Physical Activity: Not on file  Stress: Not on file  Social Connections: Not on file  Intimate Partner Violence: Not on file    Past Surgical History:  Procedure Laterality Date   CLUB FOOT RELEASE     ESOPHAGOGASTRODUODENOSCOPY N/A 06/12/2016   Procedure: ESOPHAGOGASTRODUODENOSCOPY (EGD);  Surgeon: Joycelyn Rua, MD;  Location: [facility] ENDOSCOPY;  Service: Endoscopy;  Laterality: N/A;   KNEE SURGERY     Plates placed to bilateral knees to slow growth.    No family history on file.  Allergies  Allergen Reactions   Oxycodone Nausea And Vomiting   Amoxicillin Nausea And Vomiting   Pineapple Nausea And Vomiting    Current Outpatient Medications on File Prior to Visit  Medication Sig Dispense Refill   albuterol (VENTOLIN HFA) 108 (90 Base) MCG/ACT inhaler Inhale 1-2 puffs into the lungs every 6 (six) hours as needed for wheezing or shortness of  breath. 18 g 0   ibuprofen (ADVIL) 600 MG tablet Take 1 tablet (600 mg total) by mouth 3 (three) times daily. 21 tablet 0   norethindrone-ethinyl estradiol-FE (JUNEL FE 1/20) 1-20 MG-MCG tablet Take 1 tablet by mouth daily. 84 tablet 2   lidocaine (XYLOCAINE) 2 % solution Use as directed 15 mLs in the mouth or throat as needed for mouth pain. (Patient not taking: Reported on 08/29/2021) 100 mL 0   ondansetron (ZOFRAN-ODT) 8 MG disintegrating tablet Take 1 tablet (8 mg total) by mouth every 8 (eight) hours as needed for nausea. (Patient not taking: Reported on 08/29/2021) 20 tablet 0   SUMAtriptan (IMITREX) 50 MG tablet Take 1 tablet by mouth at migraine onset. May repeat in 2 hours if headache persists or recurs. (Patient not taking: Reported on 08/29/2021) 10 tablet 0   No  current facility-administered medications on file prior to visit.    BP 90/78   Pulse (!) 125   Temp 98.4 F (36.9 C) (Temporal)   Ht 5' (1.524 m)   Wt 90 lb 6.4 oz (41 kg)   LMP 08/12/2021   SpO2 100%   BMI 17.66 kg/m  Objective:   Physical Exam Constitutional:      General: She is not in acute distress.    Appearance: She is not ill-appearing.     Comments: She smells of body odor and cat urine  Cardiovascular:     Rate and Rhythm: Normal rate and regular rhythm.  Pulmonary:     Effort: Pulmonary effort is normal.     Breath sounds: Normal breath sounds.  Abdominal:     General: Bowel sounds are normal.     Palpations: Abdomen is soft.     Tenderness: There is no abdominal tenderness.  Musculoskeletal:     Cervical back: Neck supple.  Skin:    General: Skin is warm and dry.  Neurological:     Mental Status: She is oriented to person, place, and time.  Psychiatric:        Mood and Affect: Mood normal.          Assessment & Plan:      This visit occurred during the SARS-CoV-2 public health emergency.  Safety protocols were in place, including screening questions prior to the visit, additional  usage of staff PPE, and extensive cleaning of exam room while observing appropriate contact time as indicated for disinfecting solutions.

## 2021-08-29 NOTE — Patient Instructions (Signed)
Stop by the lab prior to leaving today. I will notify you of your results once received.   Ensure you are consuming 64 ounces of water daily.  Continue to work on [employer]. Increase protein, vegetables, fruit, whole grains.  It was a pleasure to see you today!

## 2021-08-30 LAB — URINE CULTURE
MICRO NUMBER:: 12657414
SPECIMEN QUALITY:: ADEQUATE

## 2021-09-08 ENCOUNTER — Telehealth: Payer: Self-pay | Admitting: Primary Care

## 2021-09-08 DIAGNOSIS — R4020 Unspecified coma: Secondary | ICD-10-CM

## 2021-09-08 DIAGNOSIS — R55 Syncope and collapse: Secondary | ICD-10-CM

## 2021-09-08 DIAGNOSIS — G121 Other inherited spinal muscular atrophy: Secondary | ICD-10-CM

## 2021-09-08 NOTE — Telephone Encounter (Signed)
Pt called stating that she was trying to get in touch with Dr Sullivan Lone neurologist in [place]. Pt states that she cant get an early appt. Pt is asking if she can be referred to someone closer. Pt states that she needs to see a neurologist soon so she can return to work. Please advise.

## 2021-09-09 NOTE — Telephone Encounter (Signed)
Thanks for the update. I agree that she needs to get clearance from her neurologist.  Just to clarify, we don't need the new referral I placed today?

## 2021-09-09 NOTE — Addendum Note (Signed)
Addended by: Doreene Nest on: 09/09/2021 07:38 AM   Modules accepted: Orders

## 2021-09-09 NOTE — Telephone Encounter (Signed)
She states she will call us if she has trouble getting in with her neurology after her EEG

## 2021-09-09 NOTE — Telephone Encounter (Signed)
We can cancel the new neurology referral as she was able to get in touch with her prior neurologist.

## 2021-09-09 NOTE — Telephone Encounter (Signed)
Patient states that she was able to hear back from neurology that she was seen in the past they have ordered EEG for Thursday. They will call to make follow up after that test.   She has not had any episodes since being out of work. Her work is requiring a note to clear to go back to work with any restrictions. Patient would rather get note from neurology after evaluation with them.

## 2021-09-09 NOTE — Telephone Encounter (Signed)
I will place a referral for a local neurologist. Have her notify us if she hasn't heard back within one week.  Has she had any fainting episodes since being out of work?  Is her employer requiring that she stay out of work?

## 2021-09-09 NOTE — Telephone Encounter (Signed)
Are we not doing the referral to the new Neurologist? I am confused.   Sounds like the patient got an appt with her old Neurologist?

## 2021-09-10 NOTE — Telephone Encounter (Signed)
Noted. I will cancel the referral.    Thanks!

## 2021-10-20 ENCOUNTER — Other Ambulatory Visit: Payer: Self-pay

## 2021-10-20 ENCOUNTER — Ambulatory Visit (INDEPENDENT_AMBULATORY_CARE_PROVIDER_SITE_OTHER): Payer: Medicare Other | Admitting: Cardiology

## 2021-10-20 ENCOUNTER — Encounter: Payer: Self-pay | Admitting: Cardiology

## 2021-10-20 VITALS — BP 90/60 | HR 105 | Ht 60.0 in | Wt 87.0 lb

## 2021-10-20 DIAGNOSIS — I950 Idiopathic hypotension: Secondary | ICD-10-CM

## 2021-10-20 DIAGNOSIS — R55 Syncope and collapse: Secondary | ICD-10-CM

## 2021-10-20 DIAGNOSIS — R636 Underweight: Secondary | ICD-10-CM | POA: Diagnosis not present

## 2021-10-20 MED ORDER — SODIUM CHLORIDE 1 G PO TABS: 1.0000 g | ORAL_TABLET | Freq: Three times a day (TID) | ORAL | 1 refills | Status: AC

## 2021-10-20 MED ORDER — SODIUM CHLORIDE 1 G PO TABS: 1.0000 g | ORAL_TABLET | Freq: Three times a day (TID) | ORAL | 1 refills | Status: DC

## 2021-10-20 NOTE — Patient Instructions (Addendum)
Medication Instructions:   Your physician has recommended you make the following change in your medication:   START taking NaCL 1 GM three times a day.   *If you need a refill on your cardiac medications before your next appointment, please call your pharmacy*   Lab Work: None ordered If you have labs (blood work) drawn today and your tests are completely normal, you will receive your results only by: MyChart Message (if you have MyChart) OR A paper copy in the mail If you have any lab test that is abnormal or we need to change your treatment, we will call you to review the results.   Testing/Procedures: None ordered   Follow-Up: At [facility], you and your health needs are our priority.  As part of our continuing mission to provide you with exceptional heart care, we have created designated Provider Care Teams.  These Care Teams include your primary Cardiologist (physician) and Advanced Practice Providers (APPs -  Physician Assistants and Nurse Practitioners) who all work together to provide you with the care you need, when you need it.  We recommend signing up for the patient portal called "MyChart".  Sign up information is provided on this After Visit Summary.  MyChart is used to connect with patients for Virtual Visits (Telemedicine).  Patients are able to view lab/test results, encounter notes, upcoming appointments, etc.  Non-urgent messages can be sent to your provider as well.   To learn more about what you can do with MyChart, go to [email or web address].    Your next appointment:   3 month(s)  The format for your next appointment:   In Person  Provider:   You may see Debbe Odea, MD or one of the following Advanced Practice Providers on your designated Care Team:   Nicolasa Ducking, NP Eula Listen, PA-C Cadence Fransico Michael, [place]    Other Instructions

## 2021-10-20 NOTE — Progress Notes (Signed)
Cardiology Office Note:    Date:  10/20/2021   ID:  [name], DOB 10/05/2001, MRN [id number]  PCP:  Pleas Koch, NP  Cardiologist:  Kate Sable, MD  Electrophysiologist:  None   Referring MD: Pleas Koch, NP   No chief complaint on file.  History of Present Illness:    Terry Newton is a 21 y.o. female with a hx of migraines, spinal muscular atrophy who presents due to episode of syncope.   She was last seen due to syncope.  Symptoms were associated with warm showers or after having a bowel movement.  She was evaluated with a cardiac monitor and also echocardiogram which were completely normal.  Her symptoms were deemed vasovagal versus neurogenic in etiology.  She recently saw her primary care provider due to syncopal episodes/loss of consciousness.  She was standing for a long time, had not eaten and became lightheaded, nauseous, sweaty and "blacked out momentarily".  Has appointment to see neurologist.  Previously evaluated by neuro, EEG performed which apparently was normal.  Hydration and snacking was advised.  She endorses not eating well.  She takes maybe 2 or 3 bites of her food before feeling full.  Last time she passed out, she was at her home, stood up, felt nauseous, dizzy and fell facing forward.  She was with her roommate at the time.  Endorsed having nausea upon waking.   Past Medical History:  Diagnosis Date   ADHD    On Ritalin, Vyvanse   Asthma    Depression    GERD (gastroesophageal reflux disease)    Migraines    SMA (spinal muscular atrophy) ([place])    Urinary retention    Extended bladder, holds urine for extended periods of time.    Past Surgical History:  Procedure Laterality Date   CLUB FOOT RELEASE     ESOPHAGOGASTRODUODENOSCOPY N/A 06/12/2016   Procedure: ESOPHAGOGASTRODUODENOSCOPY (EGD);  Surgeon: Joycelyn Rua, MD;  Location: [facility] ENDOSCOPY;  Service: Endoscopy;  Laterality: N/A;   KNEE SURGERY     Plates placed  to bilateral knees to slow growth.    Current Medications: Current Meds  Medication Sig   [DISCONTINUED] sodium chloride 1 g tablet Take 1 tablet (1 g total) by mouth 3 (three) times daily.     Allergies:   Oxycodone, Amoxicillin, and Pineapple   Social History   Socioeconomic History   Marital status: Single    Spouse name: Not on file   Number of children: Not on file   Years of education: Not on file   Highest education level: Not on file  Occupational History   Not on file  Tobacco Use   Smoking status: Never   Smokeless tobacco: Never  Vaping Use   Vaping Use: Some days  Substance and Sexual Activity   Alcohol use: No   Drug use: No   Sexual activity: Never    Birth control/protection: Abstinence    Comment: LMP this past weekend.  LMP prior was 65 days ago.  Periods are usually regular with only a little cramping.  Other Topics Concern   Not on file  Social History Narrative   Birth history - Born by repeat c/s, no complications during pregnancy or delivery.  No sick contacts.  No thoughts of anxiety or self harm.  Lives with mother, father, 1 dog, no smoke exposure.  Patient was delayed at reaching developmental milestones, but once they were reached she has developed fine.  Single.   Attends [employer] and is studying Administration.    Enjoys spending time with boyfriend.    Social Determinants of Health   Financial Resource Strain: Not on file  Food Insecurity: Not on file  Transportation Needs: Not on file  Physical Activity: Not on file  Stress: Not on file  Social Connections: Not on file     Family History: He denies any family history of heart disease.  ROS:   Please see the history of present illness.     All other systems reviewed and are negative.  EKGs/Labs/Other Studies Reviewed:    The following studies were reviewed today:   EKG:  EKG is  ordered today.  The ekg ordered today demonstrates sinus tachycardia, heart rate 109.  Recent  Labs: 08/26/2021: ALT 20 08/29/2021: BUN 11; Creatinine, Ser 0.75; Hemoglobin 14.2; Platelets 253.0; Potassium 3.9; Sodium 141  Recent Lipid Panel    Component Value Date/Time   TRIG 63 06/16/2016 0405    Physical Exam:    VS:  BP 90/60 (BP Location: Right Arm, Patient Position: Sitting, Cuff Size: Normal)    Pulse (!) 105    Ht 5' (1.524 m)    Wt 87 lb (39.5 kg)    SpO2 99%    BMI 16.99 kg/m     Wt Readings from Last 3 Encounters:  10/20/21 87 lb (39.5 kg)  08/29/21 90 lb 6.4 oz (41 kg)  08/26/21 84 lb (38.1 kg)     GEN:  Well nourished, well developed in no acute distress HEENT: Normal NECK: No JVD; No carotid bruits LYMPHATICS: No lymphadenopathy CARDIAC: Tachycardic, regular , no murmurs, rubs, gallops RESPIRATORY:  Clear to auscultation without rales, wheezing or rhonchi  ABDOMEN: Soft, non-tender, non-distended MUSCULOSKELETAL:  No edema;  SKIN: Warm and dry NEUROLOGIC:  Alert and oriented x 3 PSYCHIATRIC:  Normal affect   ASSESSMENT:    1. Idiopathic hypotension   2. Low weight   3. Syncope, unspecified syncope type     PLAN:    In order of problems listed above:   Hypotension, systolic blood pressure while seated was 90, reduced to 84 upon standing.  Increased salt content, hydration advised.  Will start salt tablets 1 g 3 times daily. Low body weight, BMI 16.9.  Increase nourishment advised.  Refer to dietitian/nutritional services for additional input.  Adequate caloric intake I believe will help patient's overall symptoms. Patient with history of syncope.  2-week cardiac monitor and echocardiogram were normal.  Echocardiogram 01/2020 shows normal systolic and diastolic function, LVEF 60 to 65%.  Patient reassured as cardiac etiology is not likely.  Hypotension, hypoglycemia and low BMI likely contributing.  No indication for additional cardiac testing.  Follow-up in 3 months  Total encounter time more than 35 minutes  Greater than 50% was spent in  counseling and coordination of care with the patient Time spent explaining to patient findings on cardiac monitor and echocardiogram.  Etiologies for syncope also explained.   This note was generated in part or whole with voice recognition software. Voice recognition is usually quite accurate but there are transcription errors that can and very often do occur. I apologize for any typographical errors that were not detected and corrected.  Medication Adjustments/Labs and Tests Ordered: Current medicines are reviewed at length with the patient today.  Concerns regarding medicines are outlined above.  Orders Placed This Encounter  Procedures   Amb ref to Medical Nutrition Therapy-MNT   EKG 12-Lead   Meds ordered this  encounter  Medications   DISCONTD: sodium chloride 1 g tablet    Sig: Take 1 tablet (1 g total) by mouth 3 (three) times daily.    Dispense:  90 tablet    Refill:  1   sodium chloride 1 g tablet    Sig: Take 1 tablet (1 g total) by mouth 3 (three) times daily.    Dispense:  90 tablet    Refill:  1    Patient Instructions  Medication Instructions:   Your physician has recommended you make the following change in your medication:   START taking NaCL 1 GM three times a day.   *If you need a refill on your cardiac medications before your next appointment, please call your pharmacy*   Lab Work: None ordered If you have labs (blood work) drawn today and your tests are completely normal, you will receive your results only by: [place] (if you have MyChart) OR A paper copy in the mail If you have any lab test that is abnormal or we need to change your treatment, we will call you to review the results.   Testing/Procedures: None ordered   Follow-Up: At [facility], you and your health needs are our priority.  As part of our continuing mission to provide you with exceptional heart care, we have created designated Provider Care Teams.  These Care Teams  include your primary Cardiologist (physician) and Advanced Practice Providers (APPs -  Physician Assistants and Nurse Practitioners) who all work together to provide you with the care you need, when you need it.  We recommend signing up for the patient portal called "MyChart".  Sign up information is provided on this After Visit Summary.  MyChart is used to connect with patients for Virtual Visits (Telemedicine).  Patients are able to view lab/test results, encounter notes, upcoming appointments, etc.  Non-urgent messages can be sent to your provider as well.   To learn more about what you can do with MyChart, go to [email or web address].    Your next appointment:   3 month(s)  The format for your next appointment:   In Person  Provider:   You may see Kate Sable, MD or one of the following Advanced Practice Providers on your designated Care Team:   Murray Hodgkins, NP Christell Faith, PA-C Cadence Kathlen Mody, [place]    Other Instructions     Signed, Kate Sable, MD  10/20/2021 4:23 PM    [place]

## 2021-10-21 ENCOUNTER — Encounter: Payer: Self-pay | Admitting: Primary Care

## 2021-10-21 ENCOUNTER — Ambulatory Visit (INDEPENDENT_AMBULATORY_CARE_PROVIDER_SITE_OTHER): Payer: Medicare Other | Admitting: Primary Care

## 2021-10-21 VITALS — BP 98/58 | HR 129 | Temp 98.6°F | Ht 60.0 in | Wt 87.0 lb

## 2021-10-21 DIAGNOSIS — R55 Syncope and collapse: Secondary | ICD-10-CM | POA: Diagnosis not present

## 2021-10-21 DIAGNOSIS — G121 Other inherited spinal muscular atrophy: Secondary | ICD-10-CM | POA: Diagnosis not present

## 2021-10-21 DIAGNOSIS — Z23 Encounter for immunization: Secondary | ICD-10-CM

## 2021-10-21 NOTE — Progress Notes (Signed)
Subjective:    Patient ID: Terry Newton, female    DOB: May 23, 2001, 21 y.o.   MRN: [id number]  HPI  Terry Newton is a very pleasant 21 y.o. female with a history of autosomal dominant lower extremity predominant spinal muscular atrophy, neuromuscular weakness, exercise-induced asthma, recurrent syncope, chronic migraines who presents today to follow up regarding syncope and discuss fertility. She is with her friend today.   1) Syncope: Recurrent episodes of syncope that have occurred with warm showers, bowel movements, long periods of standing, long periods going without meals. Follows with neurology and cardiology, has undergone EEG per neurology which was normal. She's undergone Holter Monitor and echocardiogram which were normal. Last evaluated by cardiology yesterday.   During her cardiology visit she endorsed continued episodes of syncope when rising from a seated position with nausea. Also endorsed little appetite, eating a few bites of food at a time. She was noted to be hypotensive with sitting and further drop with standing. Recommendations were to increase salt intake, hydrate well. She was referred to a nutritionist for improvement in her weight. She was reassured that symptoms were not secondary to cardiac cause.   Today she endorses compliance to her salt tablets for which she is taking three times daily. She has an appointment scheduled with the nutritionist for 11/12/21. She has been back to work since her last visit with [place]. 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 No syncope at work.   2) Fertility Questions: She Is wanting to know if she is able to have children given her neuromuscular disorder. She is managed on OCP's and is having monthly menses. She will be working on increasing her weight by meeting with a nutritionist. She has yet to ask her neurologist regarding her fertility.   BP Readings from Last 3  Encounters:  10/21/21 (!) 98/58  10/20/21 90/60  08/29/21 90/78      Review of Systems  Constitutional:  Positive for appetite change.  Gastrointestinal:  Positive for nausea.  Neurological:  Positive for syncope and light-headedness.        Past Medical History:  Diagnosis Date   ADHD    On Ritalin, Vyvanse   Asthma    Depression    GERD (gastroesophageal reflux disease)    Migraines    SMA (spinal muscular atrophy) ([place])    Urinary retention    Extended bladder, holds urine for extended periods of time.    Social History   Socioeconomic History   Marital status: Single    Spouse name: Not on file   Number of children: Not on file   Years of education: Not on file   Highest education level: Not on file  Occupational History   Not on file  Tobacco Use   Smoking status: Never   Smokeless tobacco: Never  Vaping Use   Vaping Use: Some days  Substance and Sexual Activity   Alcohol use: No   Drug use: No   Sexual activity: Never    Birth control/protection: Abstinence    Comment: LMP this past weekend.  LMP prior was 65 days ago.  Periods are usually regular with only a little cramping.  Other Topics Concern   Not on file  Social History Narrative   Birth history - Born by repeat c/s, no complications during pregnancy or delivery.  No sick contacts.  No thoughts of anxiety or self harm.  Lives with  mother, father, 1 dog, no smoke exposure.  Patient was delayed at reaching developmental milestones, but once they were reached she has developed fine.      Single.   Attends [employer] and is studying Administration.    Enjoys spending time with boyfriend.    Social Determinants of Health   Financial Resource Strain: Not on file  Food Insecurity: Not on file  Transportation Needs: Not on file  Physical Activity: Not on file  Stress: Not on file  Social Connections: Not on file  Intimate Partner Violence: Not on file    Past Surgical History:  Procedure Laterality  Date   CLUB FOOT RELEASE     ESOPHAGOGASTRODUODENOSCOPY N/A 06/12/2016   Procedure: ESOPHAGOGASTRODUODENOSCOPY (EGD);  Surgeon: Joycelyn Rua, MD;  Location: [facility] ENDOSCOPY;  Service: Endoscopy;  Laterality: N/A;   KNEE SURGERY     Plates placed to bilateral knees to slow growth.    History reviewed. No pertinent family history.  Allergies  Allergen Reactions   Oxycodone Nausea And Vomiting   Amoxicillin Nausea And Vomiting   Pineapple Nausea And Vomiting    Current Outpatient Medications on File Prior to Visit  Medication Sig Dispense Refill   albuterol (VENTOLIN HFA) 108 (90 Base) MCG/ACT inhaler Inhale 1-2 puffs into the lungs every 6 (six) hours as needed for wheezing or shortness of breath. 18 g 0   sodium chloride 1 g tablet Take 1 tablet (1 g total) by mouth 3 (three) times daily. 90 tablet 1   No current facility-administered medications on file prior to visit.    BP (!) 98/58    Pulse (!) 129    Temp 98.6 F (37 C) (Oral)    Ht 5' (1.524 m)    Wt 87 lb (39.5 kg)    SpO2 99%    BMI 16.99 kg/m  Objective:   Physical Exam Cardiovascular:     Rate and Rhythm: Normal rate and regular rhythm.  Pulmonary:     Effort: Pulmonary effort is normal.     Breath sounds: Normal breath sounds.  Musculoskeletal:     Cervical back: Neck supple.  Skin:    General: Skin is warm and dry.  Psychiatric:        Mood and Affect: Mood normal.          Assessment & Plan:      This visit occurred during the SARS-CoV-2 public health emergency.  Safety protocols were in place, including screening questions prior to the visit, additional usage of staff PPE, and extensive cleaning of exam room while observing appropriate contact time as indicated for disinfecting solutions.

## 2021-10-21 NOTE — Patient Instructions (Signed)
Follow up with the dietician as scheduled.  Work on meal planning, scheduling healthy snacks, plenty of water intake.  It was a pleasure to see you today!

## 2021-10-21 NOTE — Assessment & Plan Note (Addendum)
Following with neurology.  Recommended she ask her neurologist regarding fertility, but I do not see why she could not conceive. Labs unremarkable, menses are regular on OCP's.   We did discuss the need for healthy weight gain. Continue OCP's for menstrual cycle regularity.

## 2021-10-21 NOTE — Assessment & Plan Note (Signed)
Reviewed notes from neurology through care everywhere and cardiology through [place]. All work up negative for neurologic or cardiac cause.  Encouraged continued salt intake, hydration, meal planning with snacks during the day. She will follow up with the dietician as scheduled.

## 2021-11-12 ENCOUNTER — Encounter: Payer: Self-pay | Admitting: Dietician

## 2021-11-12 ENCOUNTER — Encounter: Payer: Medicare Other | Attending: Cardiology | Admitting: Dietician

## 2021-11-12 ENCOUNTER — Other Ambulatory Visit: Payer: Self-pay

## 2021-11-12 VITALS — Ht 60.0 in | Wt 88.6 lb

## 2021-11-12 DIAGNOSIS — Z681 Body mass index (BMI) 19 or less, adult: Secondary | ICD-10-CM | POA: Insufficient documentation

## 2021-11-12 DIAGNOSIS — R636 Underweight: Secondary | ICD-10-CM | POA: Diagnosis present

## 2021-11-12 DIAGNOSIS — Z713 Dietary counseling and surveillance: Secondary | ICD-10-CM | POA: Diagnosis not present

## 2021-11-12 DIAGNOSIS — R55 Syncope and collapse: Secondary | ICD-10-CM

## 2021-11-12 DIAGNOSIS — G121 Other inherited spinal muscular atrophy: Secondary | ICD-10-CM

## 2021-11-12 NOTE — Progress Notes (Signed)
Medical Nutrition Therapy: Visit start time: 1330  end time: 1430  Assessment:  Diagnosis: underweight Past medical history: spinal muscular atrophy Psychosocial issues/ stress concerns: patient reports moderate to high stress level, feels she is not deal well with her stress.   Preferred learning method:  Auditory Visual Hands-on No preference indicated  Current weight: 88.6lbs Height: 5'0"  Medications, supplements: reconciled list in medical record  Progress and evaluation:  Patient reports significant weight loss in past few months, possibly due to stress which led to poor appetite. Reports weight early 2022 was about 98lbs.  Has moved out of parents' home in recent months. Roommates do most of grocery shopping and cooking  She reports low vegetable and fruit consumption, does like bananas and apples and is working on trying more fruits and vegetables She states she does skip meals/ snacks at times, occasionally goes all day without eating.   Physical activity: limited due to spinal muscular atrophy; uses crutches for walking  Dietary Intake:  Usual eating pattern includes 2 meals and 3-4 snacks per day. Dining out frequency: 0-1 meals per week.  Breakfast: cereal/ pop tart; occasionally cooked breakfast Snack: chips/ granola bars Lunch: no meal, eats snacks during the day Snack: same as am Supper: chicken, mashed potatoes/ steak and mashed potatoes Snack: none Beverages: water, usually 1 glass soda daily  Nutrition Care Education: Topics covered:  Basic nutrition: basic food groups, appropriate nutrient balance, appropriate meal and snack schedule, general nutrition guidelines    Weight gain: identifying healthy weight, enhancing caloric value of foods with healthy fats or proteins; eating at regular intervals including using reminder cues if needed;  having healthy meal and snack options close by and convenient; limiting fluids during meals; making meal times pleasant and  relaxing; taking time to de-stress/ unwind prior to eating Other: making gradual changes, a few at a time, to develop long term healthy habits   Nutritional Diagnosis:  [place]-3.1 Underweight As related to stress and poor appetite leading to small meals and skipped meals.  As evidenced by patient with current BMI of 17.  Intervention:  Instruction and discussion as noted above. Established goals for increasing caloric intake with healthy food choices. Patient has been working to eat more often and skip meals less often; she is motivated to continue to work on dietary improvements.  Education Materials given:  Gaining [employer] in a Engineer, site with food lists Snacking handout Visit summary with goals/ instructions   Learner/ who was taught:  Patient   Level of understanding: Verbalizes/ demonstrates competency   Demonstrated degree of understanding via:   Teach back Learning barriers: None  Willingness to learn/ readiness for change: Eager, change in progress   Monitoring and Evaluation:  Dietary intake, exercise, and body weight      follow up:  12/24/21 at 1:30pm

## 2021-11-12 NOTE — Patient Instructions (Addendum)
Plan to have a meal or snack every 2-4 hours during the day. Set an alarm about 3 hours after a meal or snack as a reminder to eat again.  Incorporate a vegetable or a fruit with most -- or all -- meals and/or snacks.  OK to add some healthy fat like soft (tub) margarine, vegetable (canola, olive, etc) oil, salad dressing, or mayo to foods to enhance calories. Remember that peanut butter and nuts / seeds are among the healthiest high calorie foods and make great snacks.

## 2021-12-24 ENCOUNTER — Encounter: Payer: Medicare Other | Attending: Cardiology | Admitting: Dietician

## 2021-12-24 ENCOUNTER — Other Ambulatory Visit: Payer: Self-pay

## 2021-12-24 ENCOUNTER — Encounter: Payer: Self-pay | Admitting: Dietician

## 2021-12-24 VITALS — Ht 60.0 in | Wt 90.3 lb

## 2021-12-24 DIAGNOSIS — R636 Underweight: Secondary | ICD-10-CM | POA: Diagnosis present

## 2021-12-24 DIAGNOSIS — G121 Other inherited spinal muscular atrophy: Secondary | ICD-10-CM | POA: Diagnosis present

## 2021-12-24 DIAGNOSIS — Z713 Dietary counseling and surveillance: Secondary | ICD-10-CM | POA: Insufficient documentation

## 2021-12-24 DIAGNOSIS — R55 Syncope and collapse: Secondary | ICD-10-CM | POA: Insufficient documentation

## 2021-12-24 DIAGNOSIS — Z681 Body mass index (BMI) 19 or less, adult: Secondary | ICD-10-CM | POA: Diagnosis not present

## 2021-12-24 NOTE — Progress Notes (Signed)
Medical Nutrition Therapy: Visit start time: 1330  end time: 1400  ?Assessment:  Diagnosis: underweight ?Medical history changes: no changes ?Psychosocial issues/ stress concerns: stress improving ? ?Current weight: 90.3lbs Height: 5'0" BMI:17.64 ? ?Medications, supplement changes: no changes ? ?Progress and evaluation:  ?Terry Newton feels she is eating larger quantities of food; if she starts to feel full when eating will wait for a short time and then finish.  ?She has added a lunch meal consistently into her day. ?Stress is gradually improving, had court visit yesterday with parent and issues are being resolved. ?  ?Physical activity: daily walking with crutches ? ?Dietary Intake:  ?Usual eating pattern includes 3 meals and 1 snacks per day. ?Dining out frequency: 0-1 meals per week. ? ?Breakfast: cereal or pop tart; sometimes cooked breakfast ?Snack: chips or granola bar ?Lunch: sandwich with peanut butter ?Snack: none or same as am ?Supper: chicken/ meat + mashed potatoes ?Snack: none ?Beverages: 32oz water 3x daily, 1 glass soda ? ?Nutrition Care Education: ?Topics covered:  ?Basic nutrition: appropriate nutrient balance, appropriate meal and snack schedule  ?Weight gain: reviewed progress since previous visit; healthy high-calorie snack options including nutrition beverage options; limiting fluids during and immediately before meals; staying physically active  ? ? ?Nutritional Diagnosis:  [place]-3.1 Underweight As related to history of stress and poor appetite leading to small food portions and some skipped meals.  As evidenced by patient with current BMI of 17.6, working on dietary changes to promote weight gain. ? ?Intervention:  ?Instruction and discussion as noted above. ?Commended patient for progress made.  ?Updated goals to increase variety of healthy food options and to continue to increase overall consumption of nutritious foods.  ? ?Product/process development scientist given:  ?Visit summary with goals/  instructions ? ? ?Learner/ who was taught:  ?Patient  ? ?Level of understanding: ?Verbalizes/ demonstrates competency ? ? ?Demonstrated degree of understanding via:   Teach back ?Learning barriers: ?None ? ?Willingness to learn/ readiness for change: ?Eager, change in progress ? ?Monitoring and Evaluation:  Dietary intake, exercise, and body weight ?     follow up:  02/11/22 at 1:30pm   ?

## 2021-12-24 NOTE — Patient Instructions (Addendum)
Great job adding a lunch meal and increasing food portions! ?OK to reduce water to 64oz daily if it helps with eating more food. Or stop drinking at least 15-20 minutes before a meal. Avoid drinking water during meals unless a sip is needed to wash down a bite of food. ?Try a breakfast or protein drink mix before bedtime.  ?Include a fruit for some snacks along with peanut butter, cheese, or nuts; or raw veggies dipped in ranch/ dressing/ cheese dip ?

## 2022-01-19 ENCOUNTER — Ambulatory Visit: Payer: Medicare Other | Admitting: Cardiology

## 2022-01-19 ENCOUNTER — Ambulatory Visit (HOSPITAL_COMMUNITY): Payer: Medicare Other

## 2022-01-26 ENCOUNTER — Ambulatory Visit: Payer: Medicare Other | Admitting: Cardiology

## 2022-02-05 ENCOUNTER — Ambulatory Visit (INDEPENDENT_AMBULATORY_CARE_PROVIDER_SITE_OTHER): Payer: Medicare Other | Admitting: Cardiology

## 2022-02-05 ENCOUNTER — Encounter: Payer: Self-pay | Admitting: Cardiology

## 2022-02-05 VITALS — BP 90/60 | HR 97 | Ht 60.0 in | Wt 90.0 lb

## 2022-02-05 DIAGNOSIS — I950 Idiopathic hypotension: Secondary | ICD-10-CM | POA: Diagnosis not present

## 2022-02-05 DIAGNOSIS — R636 Underweight: Secondary | ICD-10-CM

## 2022-02-05 NOTE — Patient Instructions (Signed)
Medication Instructions:  ? ?Your physician recommends that you continue on your current medications as directed. Please refer to the Current Medication list given to you today. ? ? ?*If you need a refill on your cardiac medications before your next appointment, please call your pharmacy* ? ? ?Lab Work: ? ?None ordered ? ?If you have labs (blood work) drawn today and your tests are completely normal, you will receive your results only by: ?MyChart Message (if you have MyChart) OR ?A paper copy in the mail ?If you have any lab test that is abnormal or we need to change your treatment, we will call you to review the results. ? ? ?Testing/Procedures: ? ?None ordered ? ? ?Follow-Up: ?At [facility], you and your health needs are our priority.  As part of our continuing mission to provide you with exceptional heart care, we have created designated Provider Care Teams.  These Care Teams include your primary Cardiologist (physician) and Advanced Practice Providers (APPs -  Physician Assistants and Nurse Practitioners) who all work together to provide you with the care you need, when you need it. ? ?We recommend signing up for the patient portal called "MyChart".  Sign up information is provided on this After Visit Summary.  MyChart is used to connect with patients for Virtual Visits (Telemedicine).  Patients are able to view lab/test results, encounter notes, upcoming appointments, etc.  Non-urgent messages can be sent to your provider as well.   ?To learn more about what you can do with MyChart, go to [email or web address].   ? ?Your next appointment:   ?6-8 month(s) ? ?The format for your next appointment:   ?In Person ? ?Provider:   ?You may see Debbe Odea, MD or one of the following Advanced Practice Providers on your designated Care Team:   ?Nicolasa Ducking, NP ?Eula Listen, PA-C ?Cadence Fransico Michael, PA-C  ? ? ?Other Instructions ? ? ?Important Information About Sugar ? ? ? ? ?  ?

## 2022-02-05 NOTE — Progress Notes (Signed)
?Cardiology Office Note:   ? ?Date:  02/05/2022  ? ?ID:  Terry MullingMorgan Elizabeth Halle, DOB 09/11/2001, MRN [id number] ? ?PCP:  Doreene Nestlark, Katherine K, NP  ?Cardiologist:  Debbe OdeaBrian Agbor-Etang, MD  ?Electrophysiologist:  None  ? ?Referring MD: Doreene Nestlark, Katherine K, NP  ? ?Chief Complaint  ?Patient presents with  ? Other  ?  3 month follow up -- Meds reviewed verbally with patient.   ? ?History of Present Illness:   ? ?Terry Newton is a 21 y.o. female with a hx of migraines, spinal muscular atrophy who presents for follow-up.  Being seen due to low blood pressure.  Patient's BMI is also low, previously recommended to see a nutritionist.  Has been working with dietitian to help with nourishment, snacking, increasing her BMI.  Salt tablets was started due to orthostasis and low blood pressures.  She states feeling better with both salt tabs and working with dietitian.  Has no concerns today. ? ?Prior notes ?Echo 01/2020 normal systolic and diastolic function EF 60 to 65% ?Cardiac monitor 12/2019 no significant arrhythmias, 1 episode of Mobitz 2 AV block at 2:30 AM. ? ? ?Past Medical History:  ?Diagnosis Date  ? ADHD   ? On Ritalin, Vyvanse  ? Asthma   ? Depression   ? GERD (gastroesophageal reflux disease)   ? Migraines   ? SMA (spinal muscular atrophy) (HCC)   ? Urinary retention   ? Extended bladder, holds urine for extended periods of time.  ? ? ?Past Surgical History:  ?Procedure Laterality Date  ? CLUB FOOT RELEASE    ? ESOPHAGOGASTRODUODENOSCOPY N/A 06/12/2016  ? Procedure: ESOPHAGOGASTRODUODENOSCOPY (EGD);  Surgeon: Adelene Amasichard Quan, MD;  [facility];  Service: Endoscopy;  Laterality: N/A;  ? KNEE SURGERY    ? Plates placed to bilateral knees to slow growth.  ? ? ?Current Medications: ?Current Meds  ?Medication Sig  ? albuterol (VENTOLIN HFA) 108 (90 Base) MCG/ACT inhaler Inhale 1-2 puffs into the lungs every 6 (six) hours as needed for wheezing or shortness of breath.  ? sodium chloride 1 g tablet Take 1 tablet (1 g  total) by mouth 3 (three) times daily.  ?  ? ?Allergies:   Oxycodone, Amoxicillin, and Pineapple  ? ?Social History  ? ?Socioeconomic History  ? Marital status: Single  ?  Spouse name: Not on file  ? Number of children: Not on file  ? Years of education: Not on file  ? Highest education level: Not on file  ?Occupational History  ? Not on file  ?Tobacco Use  ? Smoking status: Never  ? Smokeless tobacco: Never  ?Vaping Use  ? Vaping Use: Some days  ?Substance and Sexual Activity  ? Alcohol use: No  ? Drug use: No  ? Sexual activity: Never  ?  Birth control/protection: Abstinence  ?  Comment: LMP this past weekend.  LMP prior was 65 days ago.  Periods are usually regular with only a little cramping.  ?Other Topics Concern  ? Not on file  ?Social History Narrative  ? Birth history - Born by repeat c/s, no complications during pregnancy or delivery.  No sick contacts.  No thoughts of anxiety or self harm.  Lives with mother, father, 1 dog, no smoke exposure.  Patient was delayed at reaching developmental milestones, but once they were reached she has developed fine.  ?   ? Single.  ? Attends [employer] and is studying Administration.   ? Enjoys spending time with boyfriend.   ? ?Social Determinants  of Health  ? ?Financial Resource Strain: Not on file  ?Food Insecurity: Not on file  ?Transportation Needs: Not on file  ?Physical Activity: Not on file  ?Stress: Not on file  ?Social Connections: Not on file  ?  ? ?Family History: ?He denies any family history of heart disease. ? ?ROS:   ?Please see the history of present illness.    ? All other systems reviewed and are negative. ? ?EKGs/Labs/Other Studies Reviewed:   ? ?The following studies were reviewed today: ? ? ?EKG:  EKG is  ordered today.  The ekg ordered today demonstrates normal sinus rhythm ? ?Recent Labs: ?08/26/2021: ALT 20 ?08/29/2021: BUN 11; Creatinine, Ser 0.75; Hemoglobin 14.2; Platelets 253.0; Potassium 3.9; Sodium 141  ?Recent Lipid Panel ?   ?Component Value  Date/Time  ? TRIG 63 06/16/2016 0405  ? ? ?Physical Exam:   ? ?VS:  BP 90/60 (BP Location: Left Arm, Patient Position: Sitting, Cuff Size: Normal)   Pulse 97   Ht 5' (1.524 m)   Wt 90 lb (40.8 kg)   SpO2 99%   BMI 17.58 kg/m?    ? ?Wt Readings from Last 3 Encounters:  ?02/05/22 90 lb (40.8 kg)  ?12/24/21 90 lb 4.8 oz (41 kg)  ?11/12/21 88 lb 9.6 oz (40.2 kg)  ?  ? ?GEN:  Well nourished, well developed in no acute distress ?HEENT: Normal ?NECK: No JVD; No carotid bruits ?CARDIAC: Tachycardic, regular , no murmurs, rubs, gallops ?RESPIRATORY:  Clear to auscultation without rales, wheezing or rhonchi  ?ABDOMEN: Soft, non-tender, non-distended ?MUSCULOSKELETAL:  No edema;  ?SKIN: Warm and dry ?NEUROLOGIC:  Alert and oriented x 3 ?PSYCHIATRIC:  Normal affect  ? ?ASSESSMENT:   ? ?1. Idiopathic hypotension   ?2. Low weight   ? ?PLAN:   ? ?In order of problems listed above: ? ? ?Hypotension, BP low normal today.  Symptoms of dizziness improved with nourishment and salt tablets.  Continue salt tablets 1 g 3 times daily. ?Low body weight, BMI 17.6.  Improved from prior, continue increased nourishment as previously advised.  Follow recommendations as per dietitian/nutritional services.  ? ? ?Follow-up in 6-8 months ? ?Total encounter time more than 35 minutes ? Greater than 50% was spent in counseling and coordination of care with the patient ?Time spent explaining to patient findings on cardiac monitor and echocardiogram.  Etiologies for syncope also explained. ? ? ?This note was generated in part or whole with voice recognition software. Voice recognition is usually quite accurate but there are transcription errors that can and very often do occur. I apologize for any typographical errors that were not detected and corrected. ? ?Medication Adjustments/Labs and Tests Ordered: ?Current medicines are reviewed at length with the patient today.  Concerns regarding medicines are outlined above.  ?Orders Placed This Encounter   ?Procedures  ? EKG 12-Lead  ? ?No orders of the defined types were placed in this encounter. ? ? ?Patient Instructions  ?Medication Instructions:  ? ?Your physician recommends that you continue on your current medications as directed. Please refer to the Current Medication list given to you today. ? ? ?*If you need a refill on your cardiac medications before your next appointment, please call your pharmacy* ? ? ?Lab Work: ? ?None ordered ? ?If you have labs (blood work) drawn today and your tests are completely normal, you will receive your results only by: ?MyChart Message (if you have MyChart) OR ?A paper copy in the mail ?If you have any lab  test that is abnormal or we need to change your treatment, we will call you to review the results. ? ? ?Testing/Procedures: ? ?None ordered ? ? ?Follow-Up: ?At [facility], you and your health needs are our priority.  As part of our continuing mission to provide you with exceptional heart care, we have created designated Provider Care Teams.  These Care Teams include your primary Cardiologist (physician) and Advanced Practice Providers (APPs -  Physician Assistants and Nurse Practitioners) who all work together to provide you with the care you need, when you need it. ? ?We recommend signing up for the patient portal called "MyChart".  Sign up information is provided on this After Visit Summary.  MyChart is used to connect with patients for Virtual Visits (Telemedicine).  Patients are able to view lab/test results, encounter notes, upcoming appointments, etc.  Non-urgent messages can be sent to your provider as well.   ?To learn more about what you can do with MyChart, go to [email or web address].   ? ?Your next appointment:   ?6-8 month(s) ? ?The format for your next appointment:   ?In Person ? ?Provider:   ?You may see Debbe Odea, MD or one of the following Advanced Practice Providers on your designated Care Team:   ?Nicolasa Ducking, NP ?Eula Listen,  PA-C ?Cadence Fransico Michael, PA-C  ? ? ?Other Instructions ? ? ?Important Information About Sugar ? ? ? ? ?   ? ?Signed, ?Debbe Odea, MD  ?02/05/2022 12:22 PM    ?[facility] ?

## 2022-02-11 ENCOUNTER — Ambulatory Visit: Payer: Medicare Other | Admitting: Dietician

## 2022-02-17 ENCOUNTER — Ambulatory Visit (INDEPENDENT_AMBULATORY_CARE_PROVIDER_SITE_OTHER): Payer: Medicare Other

## 2022-02-17 VITALS — Wt 92.0 lb

## 2022-02-17 DIAGNOSIS — Z0000 Encounter for general adult medical examination without abnormal findings: Secondary | ICD-10-CM

## 2022-02-17 NOTE — Progress Notes (Signed)
? ?Subjective:  ? Terry Newton is a 21 y.o. female who presents for Medicare Annual (Initial) preventive examination. ? ?Virtual Visit via Telephone Note ? ?I connected with  Quentin Mulling on 02/17/22 at  3:30 PM EDT by telephone and verified that I am speaking with the correct person using two identifiers. ? ?Location: ?Patient: Home ?Provider: Pershing Proud [facility] [place] ?Persons participating in the virtual visit: patient/Nurse Health Advisor ?  ?I discussed the limitations, risks, security and privacy concerns of performing an evaluation and management service by telephone and the availability of in person appointments. The patient expressed understanding and agreed to proceed. ? ?Interactive audio and video telecommunications were attempted between this nurse and patient, however failed, due to patient having technical difficulties OR patient did not have access to video capability.  We continued and completed visit with audio only. ? ?Some vital signs may be absent or patient reported.  ? ?Waneta Fitting Hurman Horn, LPN  ? ?Review of Systems    ? 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 ?   ?Objective:  ?  ?Today's Vitals  ? 02/17/22 1534  ?Weight: 92 lb (41.7 kg)  ? ?Body mass index is 17.97 kg/m?. ? ? ?  02/17/2022  ?  4:03 PM 11/12/2021  ?  1:36 PM 08/04/2021  ?  9:33 AM 11/07/2020  ?  9:16 AM 05/25/2020  ?  1:58 PM 05/05/2019  ?  3:00 PM 02/28/2017  ?  2:33 PM  ?Advanced Directives  ?Does Patient Have a Medical Advance Directive? No No No No No No No  ?Would patient like information on creating a medical advance directive? No - Patient declined    No - Patient declined No - Patient declined   ? ? ?Current Medications (verified) ?Outpatient Encounter Medications as of 02/17/2022  ?Medication Sig  ? albuterol (VENTOLIN HFA) 108 (90 Base) MCG/ACT inhaler Inhale 1-2 puffs into the lungs every 6 (six)  hours as needed for wheezing or shortness of breath.  ? sodium chloride 1 g tablet Take 1 tablet (1 g total) by mouth 3 (three) times daily.  ? ?No facility-administered encounter medications on file as of 02/17/2022.  ? ? ?Allergies (verified) ?Oxycodone, Amoxicillin, and Pineapple  ? ?History: ?Past Medical History:  ?Diagnosis Date  ? ADHD   ? On Ritalin, Vyvanse  ? Allergy 09/26/99  ? Pineapple  ? Asthma   ? Depression   ? GERD (gastroesophageal reflux disease)   ? Migraines   ? SMA (spinal muscular atrophy) (HCC)   ? Urinary retention   ? Extended bladder, holds urine for extended periods of time.  ? ?Past Surgical History:  ?Procedure Laterality Date  ? CLUB FOOT RELEASE    ? ESOPHAGOGASTRODUODENOSCOPY N/A 06/12/2016  ? Procedure: ESOPHAGOGASTRODUODENOSCOPY (EGD);  Surgeon: Adelene Amas, MD;  Location[facility]COPY;  Service: Endoscopy;  Laterality: N/A;  ? KNEE SURGERY    ? Plates placed to bilateral knees to slow growth.  ? ?History reviewed. No pertinent family history. ?Social History  ? ?Socioeconomic History  ? Marital status: Single  ?  Spouse name: Not on file  ? Number of children: Not on file  ? Years of education: Not on file  ? Highest education level: Not on file  ?Occupational History  ? Occupation: Environmental health practitioner  ?Tobacco Use  ? Smoking status: Never  ? Smokeless tobacco: Never  ?Vaping Use  ? Vaping Use: Some days  ?  Substance and Sexual Activity  ? Alcohol use: No  ? Drug use: No  ? Sexual activity: Never  ?  Birth control/protection: Abstinence  ?  Comment: LMP this past weekend.  LMP prior was 65 days ago.  Periods are usually regular with only a little cramping.  ?Other Topics Concern  ? Not on file  ?Social History Narrative  ? Birth history - Born by repeat c/s, no complications during pregnancy or delivery.  No sick contacts.  No thoughts of anxiety or self harm.  Lives with mother, father, 1 dog, no smoke exposure.  Patient was delayed at reaching developmental milestones, but  once they were reached she has developed fine.  ?   ? Single.  ? Attends [employer] and is studying Administration.   ? Enjoys spending time with boyfriend.   ? ?Social Determinants of Health  ? ?Financial Resource Strain: Low Risk   ? Difficulty of Paying Living Expenses: Not hard at all  ?Food Insecurity: No Food Insecurity  ? Worried About Programme researcher, broadcasting/film/video in the Last Year: Never true  ? Ran Out of Food in the Last Year: Never true  ?Transportation Needs: No Transportation Needs  ? Lack of Transportation (Medical): No  ? Lack of Transportation (Non-Medical): No  ?Physical Activity: Inactive  ? Days of Exercise per Week: 0 days  ? Minutes of Exercise per Session: 0 min  ?Stress: No Stress Concern Present  ? Feeling of Stress : Not at all  ?Social Connections: Moderately Integrated  ? Frequency of Communication with Friends and Family: More than three times a week  ? Frequency of Social Gatherings with Friends and Family: More than three times a week  ? Attends Religious Services: More than 4 times per year  ? Active Member of Clubs or Organizations: Yes  ? Attends Banker Meetings: More than 4 times per year  ? Marital Status: Never married  ? ? ?Tobacco Counseling ?Counseling given: Not Answered ? ? ?Clinical Intake: ? ?Pre-visit preparation completed: Yes ? ?Pain : No/denies pain ? ?  ? ?BMI - recorded: 17.97 ?Nutritional Status: BMI <19  Underweight ?Nutritional Risks: None ?Diabetes: No ? ?How often do you need to have someone help you when you read instructions, pamphlets, or other written materials from your doctor or pharmacy?: 1 - Never ? ?Diabetic? no ? ?Interpreter Needed?: No ? ?Information entered by :: Llewellyn Schoenberger, LPN ? ? ?Activities of Daily Living ? ?  02/17/2022  ?  4:04 PM  ?In your present state of health, do you have any difficulty performing the following activities:  ?Hearing? 0  ?Vision? 0  ?Difficulty concentrating or making decisions? 0  ?Walking or climbing stairs? 1  ?Comment a  little, but she has crutches and is able to walk them  ?Dressing or bathing? 0  ?Doing errands, shopping? 0  ?Preparing Food and eating ? N  ?Using the Toilet? N  ?In the past six months, have you accidently leaked urine? N  ?Do you have problems with loss of bowel control? N  ?Managing your Medications? N  ?Managing your Finances? N  ?Housekeeping or managing your Housekeeping? N  ? ? ?Patient Care Team: ?Doreene Nest, NP as PCP - General (Internal Medicine) ?Debbe Odea, MD as PCP - Cardiology (Cardiology) ?Tanna Furry, MD as Referring Physician (Neurology) ?Darrick Grinder, RD as Dietitian (Dietician) ? ?Indicate any recent Medical Services you may have received from other than [facility] providers in the past year (  date may be approximate). ? ?   ?Assessment:  ? This is a routine wellness examination for [place]. ? ?Hearing/Vision screen ?Hearing Screening - Comments:: Denies hearing difficulties   ?Vision Screening - Comments:: No vision concerns - up to date [facility] ? ?Dietary issues and exercise activities discussed: ?Current Exercise Habits: The patient does not participate in regular exercise at present, Exercise limited by: neurologic condition(s) ? ? Goals Addressed   ? ?  ?  ?  ?  ? This Visit's Progress  ?  Patient Stated     ?  Maintain a healthy weight and better her health ?  ? ?  ? ?Depression Screen ? ?  02/17/2022  ?  3:47 PM 11/12/2021  ?  1:37 PM 11/25/2020  ? 11:22 AM  ?PHQ 2/9 Scores  ?PHQ - 2 Score 0 0 0  ?PHQ- 9 Score   0  ?  ?Fall Risk ? ?  02/17/2022  ?  3:55 PM 12/24/2021  ?  1:34 PM 11/12/2021  ?  1:36 PM  ?Fall Risk   ?Falls in the past year? 0 0 0  ?Number falls in past yr: 0    ?Injury with Fall? 0    ?Risk for fall due to : Impaired balance/gait;Impaired mobility;Other (Comment)  Impaired mobility  ?Risk for fall due to: Comment neuromuscular weakness  spinal muscular atrophy, uses cruches for walking  ?Follow up Education  provided;Falls prevention discussed    ? ? ?FALL RISK PREVENTION PERTAINING TO THE HOME: ? ?Any stairs in or around the home? Yes  ?If so, are there any without handrails? No  ?Home free of loose throw rugs in walkw

## 2022-02-17 NOTE — Patient Instructions (Signed)
Ms. Terry Newton, ?Thank you for taking time to come for your Medicare Wellness Visit. I appreciate your ongoing commitment to your health goals. Please review the following plan we discussed and let me know if I can assist you in the future.  ? ?Screening recommendations/referrals: ?Colonoscopy: due at age 23 ?Mammogram: due at age 40 ?Bone Density: due at age 60 ?Recommended yearly ophthalmology/optometry visit for glaucoma screening and checkup ?Recommended yearly dental visit for hygiene and checkup ? ?Vaccinations: ?Influenza vaccine: Done 10/21/2021 - repeat in fall ?Pneumococcal vaccine: Due at age 32 ?Tdap vaccine: Done 03/17/2017 - Repeat in 10 years ?Shingles vaccine: Due at age 4  ?Covid-19: Declined ? ?Advanced directives: Advance directive discussed with you today. Even though you declined this today, please call our office should you change your mind, and we can give you the proper paperwork for you to fill out.  ? ?Conditions/risks identified: Aim for 30 minutes of exercise, 6-8 glasses of water, and 5 servings of fruits and vegetables each day.  ? ?Next appointment: Follow up in one year for your annual wellness visit.  ? ?Preventive Care 14-72 Years Old, Female ?Preventive care refers to lifestyle choices and visits with your health care provider that can promote health and wellness. Preventive care visits are also called wellness exams. ?What can I expect for my preventive care visit? ?Counseling ?During your preventive care visit, your health care provider may ask about your: ?Medical history, including: ?Past medical problems. ?Family medical history. ?Pregnancy history. ?Current health, including: ?Menstrual cycle. ?Method of birth control. ?Emotional well-being. ?Home life and relationship well-being. ?Sexual activity and sexual health. ?Lifestyle, including: ?Alcohol, nicotine or tobacco, and drug use. ?Access to firearms. ?Diet, exercise, and sleep habits. ?Work and work Statistician. ?Sunscreen  use. ?Safety issues such as seatbelt and bike helmet use. ?Physical exam ?Your health care provider may check your: ?Height and weight. These may be used to calculate your BMI (body mass index). BMI is a measurement that tells if you are at a healthy weight. ?Waist circumference. This measures the distance around your waistline. This measurement also tells if you are at a healthy weight and may help predict your risk of certain diseases, such as type 2 diabetes and high blood pressure. ?Heart rate and blood pressure. ?Body temperature. ?Skin for abnormal spots. ?What immunizations do I need? ?Vaccines are usually given at various ages, according to a schedule. Your health care provider will recommend vaccines for you based on your age, medical history, and lifestyle or other factors, such as travel or where you work. ?What tests do I need? ?Screening ?Your health care provider may recommend screening tests for certain conditions. This may include: ?Pelvic exam and Pap test. ?Lipid and cholesterol levels. ?Diabetes screening. This is done by checking your blood sugar (glucose) after you have not eaten for a while (fasting). ?Hepatitis B test. ?Hepatitis C test. ?HIV (human immunodeficiency virus) test. ?STI (sexually transmitted infection) testing, if you are at risk. ?BRCA-related cancer screening. This may be done if you have a family history of breast, ovarian, tubal, or peritoneal cancers. ?Talk with your health care provider about your test results, treatment options, and if necessary, the need for more tests. ?Follow these instructions at home: ?Eating and drinking ? ?Eat a healthy diet that includes fresh fruits and vegetables, whole grains, lean protein, and low-fat dairy products. ?Take vitamin and mineral supplements as recommended by your health care provider. ?Do not drink alcohol if: ?Your health care provider tells you not to  drink. ?You are pregnant, may be pregnant, or are planning to become  pregnant. ?If you drink alcohol: ?Limit how much you have to 0-1 drink a day. ?Know how much alcohol is in your drink. In the [place] one drink equals one 12 oz bottle of beer (355 mL), one 5 oz glass of wine (148 mL), or one 1? oz glass of hard liquor (44 mL). ?Lifestyle ?Brush your teeth every morning and night with fluoride toothpaste. Floss one time each day. ?Exercise for at least 30 minutes 5 or more days each week. ?Do not use any products that contain nicotine or tobacco. These products include cigarettes, chewing tobacco, and vaping devices, such as e-cigarettes. If you need help quitting, ask your health care provider. ?Do not use drugs. ?If you are sexually active, practice safe sex. Use a condom or other form of protection to prevent STIs. ?If you do not wish to become pregnant, use a form of birth control. If you plan to become pregnant, see your health care provider for a prepregnancy visit. ?Find healthy ways to manage stress, such as: ?Meditation, yoga, or listening to music. ?Journaling. ?Talking to a trusted person. ?Spending time with friends and family. ?Minimize exposure to UV radiation to reduce your risk of skin cancer. ?Safety ?Always wear your seat belt while driving or riding in a vehicle. ?Do not drive: ?If you have been drinking alcohol. Do not ride with someone who has been drinking. ?If you have been using any mind-altering substances or drugs. ?While texting. ?When you are tired or distracted. ?Wear a helmet and other protective equipment during sports activities. ?If you have firearms in your house, make sure you follow all gun safety procedures. ?Seek help if you have been physically or sexually abused. ?What's next? ?Go to your health care provider once a year for an annual wellness visit. ?Ask your health care provider how often you should have your eyes and teeth checked. ?Stay up to date on all vaccines. ?This information is not intended to replace advice given to you by your  health care provider. Make sure you discuss any questions you have with your health care provider. ?Document Revised: 03/26/2021 Document Reviewed: 03/26/2021 ?Elsevier Patient Education ? [place].  ?

## 2022-03-19 ENCOUNTER — Other Ambulatory Visit: Payer: Self-pay | Admitting: Primary Care

## 2022-03-19 DIAGNOSIS — R0602 Shortness of breath: Secondary | ICD-10-CM

## 2022-04-01 ENCOUNTER — Encounter: Payer: Self-pay | Admitting: Dietician

## 2022-04-01 NOTE — Progress Notes (Signed)
Have not heard back from patient to reschedule her cancelled appointment from 02/11/22. Sent notification to referring provider.

## 2022-04-17 ENCOUNTER — Other Ambulatory Visit: Payer: Self-pay | Admitting: Primary Care

## 2022-04-17 DIAGNOSIS — R0602 Shortness of breath: Secondary | ICD-10-CM

## 2022-04-18 MED ORDER — ALBUTEROL SULFATE HFA 108 (90 BASE) MCG/ACT IN AERS: 1.0000 | INHALATION_SPRAY | Freq: Four times a day (QID) | RESPIRATORY_TRACT | 0 refills | Status: AC | PRN

## 2022-04-27 ENCOUNTER — Other Ambulatory Visit: Payer: Self-pay | Admitting: Primary Care

## 2022-04-27 DIAGNOSIS — R0602 Shortness of breath: Secondary | ICD-10-CM

## 2022-06-01 ENCOUNTER — Telehealth: Payer: Self-pay | Admitting: Primary Care

## 2022-06-01 NOTE — Telephone Encounter (Signed)
We received OV notes from [facility] Neurology for 06/01/22, They are in Care Everywhere

## 2022-10-15 ENCOUNTER — Telehealth: Payer: Self-pay | Admitting: Cardiology

## 2022-10-15 NOTE — Telephone Encounter (Signed)
Noted  

## 2022-10-15 NOTE — Telephone Encounter (Signed)
Patient has transferred care to new cardiologist Deleted recall

## 2023-01-04 ENCOUNTER — Emergency Department
Admission: EM | Admit: 2023-01-04 | Discharge: 2023-01-04 | Disposition: A | Discharge status: Home / Self Care | Payer: Medicare Other | Attending: Emergency Medicine | Admitting: Emergency Medicine

## 2023-01-04 DIAGNOSIS — R55 Syncope and collapse: Secondary | ICD-10-CM | POA: Diagnosis present

## 2023-01-04 DIAGNOSIS — G90A Postural orthostatic tachycardia syndrome (POTS): Secondary | ICD-10-CM | POA: Diagnosis not present

## 2023-01-04 LAB — CBC WITH DIFFERENTIAL/PLATELET
Abs Immature Granulocytes: 0.04 10*3/uL (ref 0.00–0.07)
Basophils Absolute: 0.1 10*3/uL (ref 0.0–0.1)
Basophils Relative: 1 %
Eosinophils Absolute: 0.2 10*3/uL (ref 0.0–0.5)
Eosinophils Relative: 2 %
HCT: 41.1 % (ref 36.0–46.0)
Hemoglobin: 13.4 g/dL (ref 12.0–15.0)
Immature Granulocytes: 0 %
Lymphocytes Relative: 28 %
Lymphs Abs: 2.9 10*3/uL (ref 0.7–4.0)
MCH: 28.8 pg (ref 26.0–34.0)
MCHC: 32.6 g/dL (ref 30.0–36.0)
MCV: 88.2 fL (ref 80.0–100.0)
Monocytes Absolute: 0.6 10*3/uL (ref 0.1–1.0)
Monocytes Relative: 6 %
Neutro Abs: 6.7 10*3/uL (ref 1.7–7.7)
Neutrophils Relative %: 63 %
Platelets: 220 10*3/uL (ref 150–400)
RBC: 4.66 MIL/uL (ref 3.87–5.11)
RDW: 12.8 % (ref 11.5–15.5)
WBC: 10.6 10*3/uL — ABNORMAL HIGH (ref 4.0–10.5)
nRBC: 0 % (ref 0.0–0.2)

## 2023-01-04 LAB — COMPREHENSIVE METABOLIC PANEL
ALT: 14 U/L (ref 0–44)
AST: 28 U/L (ref 15–41)
Albumin: 3.7 g/dL (ref 3.5–5.0)
Alkaline Phosphatase: 46 U/L (ref 38–126)
Anion gap: 11 (ref 5–15)
BUN: 12 mg/dL (ref 6–20)
CO2: 18 mmol/L — ABNORMAL LOW (ref 22–32)
Calcium: 8 mg/dL — ABNORMAL LOW (ref 8.9–10.3)
Chloride: 109 mmol/L (ref 98–111)
Creatinine, Ser: 0.62 mg/dL (ref 0.44–1.00)
GFR, Estimated: >60 mL/min (ref >60)
Glucose, Bld: 119 mg/dL — ABNORMAL HIGH (ref 70–99)
Potassium: 3.5 mmol/L (ref 3.5–5.1)
Sodium: 138 mmol/L (ref 135–145)
Total Bilirubin: 1.1 mg/dL (ref 0.3–1.2)
Total Protein: 6.6 g/dL (ref 6.5–8.1)

## 2023-01-04 LAB — HCG, QUANTITATIVE, PREGNANCY: hCG, Beta Chain, Quant, S: <1 m[IU]/mL (ref <5)

## 2023-01-04 LAB — CK: Total CK: 65 U/L (ref 38–234)

## 2023-01-04 NOTE — ED Triage Notes (Addendum)
Pt arrived via EMS from home. Per EMS, Pt has a hx of SMA,POTS and believes that she passed out yesterday and had a seizure. Per EMS, pt did pass out on them and started to have what looked like a seizure. Pt is A/Ox4 at this time.

## 2023-01-04 NOTE — ED Provider Notes (Signed)
[facility] Provider Note   Event Date/Time   First MD Initiated Contact with Patient 01/04/23 1620     (approximate) History  Seizures  HPI Terry Newton is a 22 y.o. female with a stated past medical history of POTS who presents for multiple episodes of syncope over the last few days that have resulted in shaking-like activity that bystanders have been concerned for possible seizure.  Patient states that she was worked up for seizures recently and no evidence was found on EEG.  Patient states that over the last few days she has had 2 episodes of syncope in which she has lost consciousness sitting down for under 2 minutes and had an episode of tonic activity in bilateral upper extremities.  Patient returned to baseline and under 5 minutes after each occurrence of loss of consciousness.  Patient states that this is similar to previous syncopal events due to her POTS in the past. ROS: Patient currently denies any vision changes, tinnitus, difficulty speaking, facial droop, sore throat, chest pain, shortness of breath, abdominal pain, nausea/vomiting/diarrhea, dysuria, or weakness/numbness/paresthesias in any extremity   Physical Exam  Triage Vital Signs: ED Triage Vitals  Enc Vitals Group     BP 01/04/23 1543 114/61     Pulse Rate 01/04/23 1537 93     Resp 01/04/23 1537 17     Temp 01/04/23 1537 98.1 F (36.7 C)     Temp Source 01/04/23 1537 Oral     SpO2 01/04/23 1537 93 %     Weight 01/04/23 1542 96 lb (43.5 kg)     Height --      Head Circumference --      Peak Flow --      Pain Score 01/04/23 1542 0     Pain Loc --      Pain Edu? --      Excl. in [place]? --    Most recent vital signs: Vitals:   01/04/23 1537 01/04/23 1543  BP:  114/61  Pulse: 93   Resp: 17   Temp: 98.1 F (36.7 C)   SpO2: 93%    General: Awake, oriented x4. CV:  Good peripheral perfusion.  Resp:  Normal effort.  Abd:  No distention.  Other:  Cachectic young adult  Caucasian female laying in bed in no acute distress.  Mild muscle wasting to bilateral lower extremities ED Results / Procedures / Treatments  Labs (all labs ordered are listed, but only abnormal results are displayed) Labs Reviewed  COMPREHENSIVE METABOLIC PANEL - Abnormal; Notable for the following components:      Result Value   CO2 18 (*)    Glucose, Bld 119 (*)    Calcium 8.0 (*)    All other components within normal limits  CBC WITH DIFFERENTIAL/PLATELET - Abnormal; Notable for the following components:   WBC 10.6 (*)    All other components within normal limits  CK  HCG, QUANTITATIVE, PREGNANCY   EKG ED ECG REPORT I, Naaman Plummer, the attending physician, personally viewed and interpreted this ECG. Date: 01/04/2023 EKG Time: 1628 Rate: 81 Rhythm: normal sinus rhythm QRS Axis: normal Intervals: normal ST/T Wave abnormalities: normal Narrative Interpretation: no evidence of acute ischemia PROCEDURES: Critical Care performed: No .1-3 Lead EKG Interpretation  Performed by: Naaman Plummer, MD Authorized by: Naaman Plummer, MD     Interpretation: normal     ECG rate:  71   ECG rate assessment: normal     Rhythm:  sinus rhythm     Ectopy: none     Conduction: normal    MEDICATIONS ORDERED IN ED: Medications - No data to display IMPRESSION / MDM / [place] / ED COURSE  I reviewed the triage vital signs and the nursing notes.                             The patient is on the cardiac monitor to evaluate for evidence of arrhythmia and/or significant heart rate changes. Patient's presentation is most consistent with acute presentation with potential threat to life or bodily function. Patient presents with complaints of syncope/presyncope ED Workup:  CBC, BMP, Troponin, BNP, ECG, CXR Differential diagnosis includes HF, ICH, seizure, stroke, HOCM, ACS, aortic dissection, malignant arrhythmia, or GI bleed. Findings: No evidence of acute laboratory  abnormalities.  Troponin negative x1 EKG: No e/o STEMI. No evidence of Brugadas sign, delta wave, epsilon wave, significantly prolonged QTc, or malignant arrhythmia.  Disposition: Discharge. Patient is at baseline at this time. Return precautions expressed and understood in person. Advised follow up with primary care provider or clinic physician in next 24 hours.   FINAL CLINICAL IMPRESSION(S) / ED DIAGNOSES   Final diagnoses:  Syncope and collapse  POTS (postural orthostatic tachycardia syndrome)   Rx / DC Orders   ED Discharge Orders     None      Note:  This document was prepared using Dragon voice recognition software and may include unintentional dictation errors.   Naaman Plummer, MD 01/04/23 [phone]

## 2023-01-05 ENCOUNTER — Telehealth: Payer: Self-pay

## 2023-01-05 NOTE — Transitions of Care (Post Inpatient/ED Visit) (Signed)
   01/05/2023  Name: Terry Newton MRN: [id number] DOB: 2001/07/14  Today's TOC FU Call Status: Today's TOC FU Call Status:: Successful TOC FU Call Competed TOC FU Call Complete Date: 01/05/23  Transition Care Management Follow-up Telephone Call Date of Discharge: 01/04/23 Discharge Facility: [facility] Type of Discharge: Emergency Department Reason for ED Visit: Other: (Syncope and collapse) How have you been since you were released from the hospital?: Better Any questions or concerns?: No  Items Reviewed: Did you receive and understand the discharge instructions provided?: Yes Medications obtained and verified?: Yes (Medications Reviewed) Any new allergies since your discharge?: No Dietary orders reviewed?: Yes Do you have support at home?: No  Home Care and Equipment/Supplies: [place] Ordered?: NA Any new equipment or medical supplies ordered?: NA  Functional Questionnaire: Do you need assistance with bathing/showering or dressing?: No Do you need assistance with meal preparation?: No Do you need assistance with eating?: No Do you have difficulty maintaining continence: No Do you need assistance with getting out of bed/getting out of a chair/moving?: No Do you have difficulty managing or taking your medications?: No  Follow up appointments reviewed: PCP Follow-up appointment confirmed?: No (specialist) MD Provider Line Number:[phone] Given: Yes [facility] Follow-up appointment confirmed?: No Reason Specialist Follow-Up Not Confirmed: Patient has Specialist Provider Number and will Call for Appointment Do you need transportation to your follow-up appointment?: No Do you understand care options if your condition(s) worsen?: Yes-patient verbalized understanding    [place] LPN [place] Direct Dial [phone]
